# Patient Record
Sex: Female | Born: 1970 | Race: White | Hispanic: No | Smoking: Former smoker
Health system: Southern US, Community
[De-identification: ages and names within clinical notes are randomized; demographics above are authoritative.]

## PROBLEM LIST (undated history)

## (undated) DIAGNOSIS — T8859XA Other complications of anesthesia, initial encounter: Secondary | ICD-10-CM

## (undated) DIAGNOSIS — T7840XA Allergy, unspecified, initial encounter: Secondary | ICD-10-CM

## (undated) DIAGNOSIS — M1990 Unspecified osteoarthritis, unspecified site: Secondary | ICD-10-CM

## (undated) DIAGNOSIS — Z5189 Encounter for other specified aftercare: Secondary | ICD-10-CM

## (undated) DIAGNOSIS — T4145XA Adverse effect of unspecified anesthetic, initial encounter: Secondary | ICD-10-CM

## (undated) DIAGNOSIS — R519 Headache, unspecified: Secondary | ICD-10-CM

## (undated) DIAGNOSIS — E785 Hyperlipidemia, unspecified: Secondary | ICD-10-CM

## (undated) DIAGNOSIS — E8809 Other disorders of plasma-protein metabolism, not elsewhere classified: Secondary | ICD-10-CM

## (undated) DIAGNOSIS — R51 Headache: Secondary | ICD-10-CM

## (undated) DIAGNOSIS — E041 Nontoxic single thyroid nodule: Secondary | ICD-10-CM

## (undated) DIAGNOSIS — F32A Depression, unspecified: Secondary | ICD-10-CM

## (undated) DIAGNOSIS — F419 Anxiety disorder, unspecified: Secondary | ICD-10-CM

## (undated) DIAGNOSIS — E079 Disorder of thyroid, unspecified: Secondary | ICD-10-CM

## (undated) HISTORY — DX: Hyperlipidemia, unspecified: E78.5

## (undated) HISTORY — DX: Disorder of thyroid, unspecified: E07.9

## (undated) HISTORY — PX: WISDOM TOOTH EXTRACTION: SHX21

## (undated) HISTORY — PX: APPENDECTOMY: SHX54

## (undated) HISTORY — PX: WRIST RECONSTRUCTION: SHX2675

## (undated) HISTORY — DX: Allergy, unspecified, initial encounter: T78.40XA

## (undated) HISTORY — DX: Unspecified osteoarthritis, unspecified site: M19.90

## (undated) HISTORY — PX: SHOULDER ARTHROSCOPY: SHX128

## (undated) HISTORY — DX: Other disorders of plasma-protein metabolism, not elsewhere classified: E88.09

## (undated) HISTORY — PX: TONSILLECTOMY: SUR1361

## (undated) HISTORY — DX: Encounter for other specified aftercare: Z51.89

## (undated) HISTORY — PX: INNER EAR SURGERY: SHX679

## (undated) HISTORY — PX: TONSILECTOMY, ADENOIDECTOMY, BILATERAL MYRINGOTOMY AND TUBES: SHX2538

## (undated) HISTORY — PX: SALPINGOOPHORECTOMY: SHX82

## (undated) HISTORY — PX: TUBAL LIGATION: SHX77

## (undated) HISTORY — PX: MINOR CARPAL TUNNEL: SHX6472

---

## 1993-11-22 DIAGNOSIS — E8809 Other disorders of plasma-protein metabolism, not elsewhere classified: Secondary | ICD-10-CM

## 1993-11-22 HISTORY — DX: Other disorders of plasma-protein metabolism, not elsewhere classified: E88.09

## 1994-11-22 DIAGNOSIS — Z5189 Encounter for other specified aftercare: Secondary | ICD-10-CM

## 1994-11-22 HISTORY — DX: Encounter for other specified aftercare: Z51.89

## 2000-11-22 HISTORY — PX: SPINE SURGERY: SHX786

## 2016-12-01 ENCOUNTER — Ambulatory Visit: Payer: Self-pay | Admitting: Family Medicine

## 2016-12-23 ENCOUNTER — Encounter: Payer: Self-pay | Admitting: Family Medicine

## 2016-12-23 ENCOUNTER — Ambulatory Visit (INDEPENDENT_AMBULATORY_CARE_PROVIDER_SITE_OTHER): Payer: Medicare HMO | Admitting: Family Medicine

## 2016-12-23 VITALS — BP 121/77 | HR 87 | Temp 97.4°F | Ht 63.0 in | Wt 179.0 lb

## 2016-12-23 DIAGNOSIS — B351 Tinea unguium: Secondary | ICD-10-CM | POA: Diagnosis not present

## 2016-12-23 DIAGNOSIS — Z131 Encounter for screening for diabetes mellitus: Secondary | ICD-10-CM | POA: Diagnosis not present

## 2016-12-23 DIAGNOSIS — H938X2 Other specified disorders of left ear: Secondary | ICD-10-CM

## 2016-12-23 DIAGNOSIS — Z13220 Encounter for screening for lipoid disorders: Secondary | ICD-10-CM

## 2016-12-23 DIAGNOSIS — Z114 Encounter for screening for human immunodeficiency virus [HIV]: Secondary | ICD-10-CM | POA: Diagnosis not present

## 2016-12-23 MED ORDER — PREDNISONE 20 MG PO TABS: ORAL_TABLET | ORAL | 0 refills | Status: DC

## 2016-12-23 NOTE — Progress Notes (Signed)
BP 121/77   Pulse 87   Temp 97.4 F (36.3 C) (Oral)   Ht 5' 3"  (1.6 m)   Wt 179 lb (81.2 kg)   LMP 12/09/2016 (Approximate)   BMI 31.71 kg/m    Subjective:    Patient ID: Emily Weeks, female    DOB: 08-18-1971, 46 y.o.   MRN: [id number]  HPI: Emily Weeks is a 46 y.o. female presenting on 12/23/2016 for New Patient (Initial Visit) (pt here today to establish care and also c/o left ear "clogged up")   HPI Congestion of left ear Patient comes in today to establish care with our office and has complaints of congestion of her left ear. She has had chronic issues off and on with this congestion and pressure and muffled sound in her left ear. She has had steroids previously to help clear this up. She is currently taking an antihistamine which she takes when she gets like this. She does not feel like it is helping and wants to get another course of prednisone like she has had previously with her other physicians. She does think it's a lot related to allergies. She also gets some sinus congestion and pressure and postnasal drainage but denies any fevers or chills or shortness of breath or wheezing.  Toenail problems Patient has chronic issues with thickened and brittle toenails and toenail fungus that and that causing her to have ingrown toenails that are very painful. She has been fighting this off and on for quite a few years and wants to go see a podiatrist to see if he can help with this. She denies any erythema or warmth but just more tender and then the fungus and thickening and yellowing. There is no purulence or drainage.  Relevant past medical, surgical, family and social history reviewed and updated as indicated. Interim medical history since our last visit reviewed. Allergies and medications reviewed and updated.  Review of Systems  Constitutional: Negative for chills and fever.  HENT: Positive for congestion, postnasal drip, sinus pressure and sneezing. Negative for ear  discharge, ear pain, rhinorrhea and sore throat.   Eyes: Negative for pain, redness and visual disturbance.  Respiratory: Negative for chest tightness and shortness of breath.   Cardiovascular: Negative for chest pain and leg swelling.  Genitourinary: Negative for difficulty urinating and dysuria.  Musculoskeletal: Negative for back pain and gait problem.  Skin: Negative for rash.  Neurological: Negative for light-headedness and headaches.  Psychiatric/Behavioral: Negative for agitation and behavioral problems.  All other systems reviewed and are negative.   Per HPI unless specifically indicated above  Social History   Social History  . Marital status: Married    Spouse name: N/A  . Number of children: N/A  . Years of education: N/A   Occupational History  . Not on file.   Social History Main Topics  . Smoking status: Current Every Day Smoker    Packs/day: 0.50    Types: Cigarettes    Start date: 12/23/1986  . Smokeless tobacco: Never Used  . Alcohol use 1.2 oz/week    2 Cans of beer per week  . Drug use: No  . Sexual activity: Yes    Partners: Male     Comment: married 25 years   Other Topics Concern  . Not on file   Social History Narrative  . No narrative on file    Past Surgical History:  Procedure Laterality Date  . APPENDECTOMY    . INNER EAR SURGERY    .  MINOR CARPAL TUNNEL    . SALPINGOOPHORECTOMY Right   . SHOULDER ARTHROSCOPY    . [place]  2002  . TONSILECTOMY, ADENOIDECTOMY, BILATERAL MYRINGOTOMY AND TUBES    . WRIST RECONSTRUCTION      Family History  Problem Relation Age of Onset  . Arthritis Mother   . Arthritis Father   . Diabetes Father   . Heart disease Father   . Hyperlipidemia Father   . Hypertension Father   . Stroke Father     Allergies as of 12/23/2016      Reactions   Clindamycin/lincomycin Rash   Sulfa Antibiotics Rash      Medication List       Accurate as of 12/23/16  9:23 AM. Always use your most recent med list.            multivitamin-iron-minerals-folic acid chewable tablet Chew 1 tablet by mouth daily.   predniSONE 20 MG tablet Commonly known as:  DELTASONE 2 po at same time daily for 5 days          Objective:    BP 121/77   Pulse 87   Temp 97.4 F (36.3 C) (Oral)   Ht 5' 3"  (1.6 m)   Wt 179 lb (81.2 kg)   LMP 12/09/2016 (Approximate)   BMI 31.71 kg/m   Wt Readings from Last 3 Encounters:  12/23/16 179 lb (81.2 kg)    Physical Exam  Constitutional: She is oriented to person, place, and time. She appears well-developed and well-nourished. No distress.  HENT:  Right Ear: Tympanic membrane and ear canal normal.  Left Ear: Ear canal normal. No drainage. No mastoid tenderness. Tympanic membrane is bulging. Tympanic membrane is not injected, not scarred, not perforated, not erythematous and not retracted.  Nose: Nose normal. Right sinus exhibits no maxillary sinus tenderness and no frontal sinus tenderness. Left sinus exhibits no maxillary sinus tenderness and no frontal sinus tenderness.  Mouth/Throat: Oropharynx is clear and moist and mucous membranes are normal.  Eyes: Conjunctivae are normal.  Neck: Neck supple. No thyromegaly present.  Cardiovascular: Normal rate, regular rhythm, normal heart sounds and intact distal pulses.   No murmur heard. Pulmonary/Chest: Effort normal and breath sounds normal. No respiratory distress. She has no wheezes. She has no rales.  Musculoskeletal: Normal range of motion. She exhibits no edema or tenderness.  Lymphadenopathy:    She has no cervical adenopathy.  Neurological: She is alert and oriented to person, place, and time. Coordination normal.  Skin: Skin is warm and dry. No rash noted. She is not diaphoretic.  Thickened and yellowing of the toenails. No erythema or warmth noted in paronychia region.  Psychiatric: She has a normal mood and affect. Her behavior is normal.  Nursing note and vitals reviewed.   No results found for this or  any previous visit.    Assessment & Plan:   Problem List Items Addressed This Visit    None    Visit Diagnoses    Onychomycosis    -  Primary   Patient has chronic problems with her toenails, would like referral to podiatry.   Relevant Orders   Ambulatory referral to Podiatry   Congestion of left ear       Has had chronic issues off and on with this, usually gets prednisone for this, we'll do Flonase and prednisone and antihistamine   Relevant Medications   predniSONE (DELTASONE) 20 MG tablet   Lipid screening       Relevant Medications  atorvastatin (LIPITOR) 40 MG tablet   Other Relevant Orders   Lipid panel (Completed)   Diabetes mellitus screening       Relevant Orders   CMP14+EGFR (Completed)   Screening for HIV without presence of risk factors       Relevant Orders   HIV antibody (Completed)       Follow up plan: Return if symptoms worsen or fail to improve.  Caryl Pina, MD [facility] 12/23/2016, 9:23 AM

## 2016-12-24 LAB — CMP14+EGFR
A/G RATIO: 1.7 (ref 1.2–2.2)
ALBUMIN: 4.5 g/dL (ref 3.5–5.5)
ALK PHOS: 62 IU/L (ref 39–117)
ALT: 10 IU/L (ref 0–32)
AST: 10 IU/L (ref 0–40)
BILIRUBIN TOTAL: 0.3 mg/dL (ref 0.0–1.2)
BUN / CREAT RATIO: 14 (ref 9–23)
BUN: 11 mg/dL (ref 6–24)
CHLORIDE: 102 mmol/L (ref 96–106)
CO2: 24 mmol/L (ref 18–29)
Calcium: 9.7 mg/dL (ref 8.7–10.2)
Creatinine, Ser: 0.77 mg/dL (ref 0.57–1.00)
GFR calc Af Amer: 109 mL/min/{1.73_m2} (ref >59)
GFR calc non Af Amer: 94 mL/min/{1.73_m2} (ref >59)
GLUCOSE: 84 mg/dL (ref 65–99)
Globulin, Total: 2.6 g/dL (ref 1.5–4.5)
POTASSIUM: 4.9 mmol/L (ref 3.5–5.2)
Sodium: 142 mmol/L (ref 134–144)
Total Protein: 7.1 g/dL (ref 6.0–8.5)

## 2016-12-24 LAB — LIPID PANEL
CHOL/HDL RATIO: 8.1 ratio — AB (ref 0.0–4.4)
CHOLESTEROL TOTAL: 307 mg/dL — AB (ref 100–199)
HDL: 38 mg/dL — ABNORMAL LOW (ref >39)
LDL Calculated: 221 mg/dL — ABNORMAL HIGH (ref 0–99)
Triglycerides: 239 mg/dL — ABNORMAL HIGH (ref 0–149)
VLDL Cholesterol Cal: 48 mg/dL — ABNORMAL HIGH (ref 5–40)

## 2016-12-24 LAB — HIV ANTIBODY (ROUTINE TESTING W REFLEX): HIV SCREEN 4TH GENERATION: NONREACTIVE

## 2016-12-24 MED ORDER — ATORVASTATIN CALCIUM 40 MG PO TABS: 40.0000 mg | ORAL_TABLET | Freq: Every day | ORAL | 3 refills | Status: DC

## 2017-01-03 ENCOUNTER — Encounter: Payer: Self-pay | Admitting: Family Medicine

## 2017-01-13 DIAGNOSIS — L600 Ingrowing nail: Secondary | ICD-10-CM | POA: Diagnosis not present

## 2017-01-13 DIAGNOSIS — L03031 Cellulitis of right toe: Secondary | ICD-10-CM | POA: Diagnosis not present

## 2017-01-20 ENCOUNTER — Ambulatory Visit: Payer: Medicare HMO | Admitting: Family Medicine

## 2017-01-21 ENCOUNTER — Ambulatory Visit: Payer: Medicare HMO | Admitting: Family Medicine

## 2017-02-01 DIAGNOSIS — L03031 Cellulitis of right toe: Secondary | ICD-10-CM | POA: Diagnosis not present

## 2017-02-01 DIAGNOSIS — L600 Ingrowing nail: Secondary | ICD-10-CM | POA: Diagnosis not present

## 2017-02-17 DIAGNOSIS — B351 Tinea unguium: Secondary | ICD-10-CM | POA: Diagnosis not present

## 2017-02-17 DIAGNOSIS — L03032 Cellulitis of left toe: Secondary | ICD-10-CM | POA: Diagnosis not present

## 2017-04-27 ENCOUNTER — Ambulatory Visit (INDEPENDENT_AMBULATORY_CARE_PROVIDER_SITE_OTHER): Payer: Medicare HMO | Admitting: Pediatrics

## 2017-04-27 ENCOUNTER — Encounter: Payer: Self-pay | Admitting: Pediatrics

## 2017-04-27 VITALS — BP 99/65 | HR 71 | Temp 97.0°F | Ht 63.0 in | Wt 173.0 lb

## 2017-04-27 DIAGNOSIS — N63 Unspecified lump in unspecified breast: Secondary | ICD-10-CM

## 2017-04-27 DIAGNOSIS — Z0000 Encounter for general adult medical examination without abnormal findings: Secondary | ICD-10-CM | POA: Diagnosis not present

## 2017-04-27 DIAGNOSIS — Z1239 Encounter for other screening for malignant neoplasm of breast: Secondary | ICD-10-CM

## 2017-04-27 DIAGNOSIS — E785 Hyperlipidemia, unspecified: Secondary | ICD-10-CM | POA: Diagnosis not present

## 2017-04-27 DIAGNOSIS — R232 Flushing: Secondary | ICD-10-CM | POA: Diagnosis not present

## 2017-04-27 MED ORDER — VENLAFAXINE HCL 37.5 MG PO TABS: 37.5000 mg | ORAL_TABLET | Freq: Two times a day (BID) | ORAL | 3 refills | Status: DC

## 2017-04-27 NOTE — Progress Notes (Signed)
  Subjective:   Patient ID: Emily Weeks, female    DOB: December 08, 1970, 46 y.o.   MRN: [id number] CC: Annual Exam; Gynecologic Exam (Last Menstrual cycle march 2018); and Hot Flashes  HPI: Emily Weeks is a 46 y.o. female presenting for Annual Exam; Gynecologic Exam (Last Menstrual cycle march 2018); and Hot Flashes  Hot flashes started about 9 months ago LMP in March Past 3 months has had hot flashes No vaginal discharge, no dyspareunia  No family history of breast cancer Has R sided breast lump she says "was being followed" in [place] prior to moving down here No biopsy of R side Had ultrasound of lump on L side, told it was fatty tissue  HLD: was on atorvastatin, caused achiness so stopped Walks regularly now  Migraines: has pressure at base of head at times Happening a few times a month  Normal stooling  Relevant past medical, surgical, family and social history reviewed. Allergies and medications reviewed and updated. History  Smoking Status  . Current Every Day Smoker  . Packs/day: 0.50  . Types: Cigarettes  . Start date: 12/23/1986  Smokeless Tobacco  . Never Used   ROS: All systems negative other than what is in HPI  Objective:    BP 99/65   Pulse 71   Temp 97 F (36.1 C) (Oral)   Ht 5' 3"  (1.6 m)   Wt 173 lb (78.5 kg)   LMP 01/25/2017   BMI 30.65 kg/m   Wt Readings from Last 3 Encounters:  04/27/17 173 lb (78.5 kg)  12/23/16 179 lb (81.2 kg)    Gen: NAD, alert, cooperative with exam, NCAT EYES: EOMI, no conjunctival injection, or no icterus ENT:  TMs pearly gray b/l, OP without erythema LYMPH: no cervical LAD CV: NRRR, normal S1/S2, no murmur, distal pulses 2+ b/l Resp: CTABL, no wheezes, normal WOB Abd: +BS, soft, NTND. no guarding or organomegaly Ext: No edema, warm Neuro: Alert and oriented, strength equal b/l UE and LE, coordination grossly normal GU: normal ext female genitalia, scant discharge present in vaginal vault, normal appearing  cervix Breast: R outer upper quadrant with 1 cm palpable mass Densities in L breast upper quadrants  Chaperone present throughout exam  Assessment & Plan:  Emily Weeks was seen today for annual exam, gynecologic exam and hot flashes.  Diagnoses and all orders for this visit:  Encounter for preventive health examination -     BMP8+EGFR  Breast lump eval R breast lump Had u/s of L breast recently per pt -     MM Digital Diagnostic Unilat R; Future  Screening for breast cancer -     MM Digital Screening; Future  Hot flashes -     TSH -     venlafaxine (EFFEXOR) 37.5 MG tablet; Take 1 tablet (37.5 mg total) by mouth 2 (two) times daily.  Hyperlipidemia, unspecified hyperlipidemia type Fasting today, not tolerant of atorvastatin Repeat lipid panel -     Lipid panel -     Pap IG and HPV (high risk) DNA detection   Follow up plan: 3 months Assunta Found, MD [place]

## 2017-04-28 ENCOUNTER — Other Ambulatory Visit: Payer: Self-pay | Admitting: Pediatrics

## 2017-04-28 DIAGNOSIS — E785 Hyperlipidemia, unspecified: Secondary | ICD-10-CM

## 2017-04-28 DIAGNOSIS — E782 Mixed hyperlipidemia: Secondary | ICD-10-CM | POA: Insufficient documentation

## 2017-04-28 LAB — LIPID PANEL
CHOLESTEROL TOTAL: 311 mg/dL — AB (ref 100–199)
Chol/HDL Ratio: 8.2 ratio — ABNORMAL HIGH (ref 0.0–4.4)
HDL: 38 mg/dL — AB (ref >39)
LDL Calculated: 236 mg/dL — ABNORMAL HIGH (ref 0–99)
TRIGLYCERIDES: 186 mg/dL — AB (ref 0–149)
VLDL CHOLESTEROL CAL: 37 mg/dL (ref 5–40)

## 2017-04-28 LAB — BMP8+EGFR
BUN / CREAT RATIO: 16 (ref 9–23)
BUN: 13 mg/dL (ref 6–24)
CO2: 22 mmol/L (ref 18–29)
CREATININE: 0.82 mg/dL (ref 0.57–1.00)
Calcium: 9.8 mg/dL (ref 8.7–10.2)
Chloride: 102 mmol/L (ref 96–106)
GFR, EST AFRICAN AMERICAN: 101 mL/min/{1.73_m2} (ref >59)
GFR, EST NON AFRICAN AMERICAN: 87 mL/min/{1.73_m2} (ref >59)
Glucose: 97 mg/dL (ref 65–99)
Potassium: 4.4 mmol/L (ref 3.5–5.2)
SODIUM: 140 mmol/L (ref 134–144)

## 2017-04-28 LAB — TSH: TSH: 2.39 u[IU]/mL (ref 0.450–4.500)

## 2017-04-28 MED ORDER — PRAVASTATIN SODIUM 20 MG PO TABS: 20.0000 mg | ORAL_TABLET | Freq: Every day | ORAL | 3 refills | Status: DC

## 2017-04-29 ENCOUNTER — Encounter: Payer: Self-pay | Admitting: Pediatrics

## 2017-04-30 LAB — PAP IG AND HPV HIGH-RISK
HPV, high-risk: NEGATIVE
PAP Smear Comment: 0

## 2017-04-30 MED ORDER — FLUOXETINE HCL 10 MG PO TABS: 10.0000 mg | ORAL_TABLET | Freq: Every day | ORAL | 3 refills | Status: DC

## 2017-05-09 ENCOUNTER — Other Ambulatory Visit: Payer: Self-pay | Admitting: Pediatrics

## 2017-05-09 ENCOUNTER — Encounter: Payer: Self-pay | Admitting: Pediatrics

## 2017-05-09 DIAGNOSIS — N6310 Unspecified lump in the right breast, unspecified quadrant: Secondary | ICD-10-CM

## 2017-05-24 ENCOUNTER — Encounter (HOSPITAL_COMMUNITY): Payer: Self-pay

## 2017-06-01 ENCOUNTER — Other Ambulatory Visit: Payer: Self-pay | Admitting: Pediatrics

## 2017-06-01 DIAGNOSIS — N6320 Unspecified lump in the left breast, unspecified quadrant: Secondary | ICD-10-CM

## 2017-06-07 ENCOUNTER — Ambulatory Visit (HOSPITAL_COMMUNITY)
Admission: RE | Admit: 2017-06-07 | Discharge: 2017-06-07 | Disposition: A | Discharge status: Home / Self Care | Payer: Medicare HMO | Source: Ambulatory Visit | Attending: Pediatrics | Admitting: Pediatrics

## 2017-06-07 DIAGNOSIS — N63 Unspecified lump in unspecified breast: Secondary | ICD-10-CM

## 2017-06-07 DIAGNOSIS — N6310 Unspecified lump in the right breast, unspecified quadrant: Secondary | ICD-10-CM

## 2017-06-07 DIAGNOSIS — N6001 Solitary cyst of right breast: Secondary | ICD-10-CM | POA: Diagnosis not present

## 2017-06-07 DIAGNOSIS — N6314 Unspecified lump in the right breast, lower inner quadrant: Secondary | ICD-10-CM | POA: Insufficient documentation

## 2017-06-07 DIAGNOSIS — R928 Other abnormal and inconclusive findings on diagnostic imaging of breast: Secondary | ICD-10-CM | POA: Diagnosis not present

## 2017-06-14 ENCOUNTER — Encounter: Payer: Self-pay | Admitting: Pediatrics

## 2017-06-30 ENCOUNTER — Other Ambulatory Visit: Payer: Self-pay | Admitting: Pediatrics

## 2017-06-30 DIAGNOSIS — N6310 Unspecified lump in the right breast, unspecified quadrant: Secondary | ICD-10-CM

## 2017-07-28 ENCOUNTER — Encounter: Payer: Self-pay | Admitting: Pediatrics

## 2017-07-28 ENCOUNTER — Ambulatory Visit (INDEPENDENT_AMBULATORY_CARE_PROVIDER_SITE_OTHER): Payer: Medicare HMO | Admitting: Pediatrics

## 2017-07-28 VITALS — BP 110/65 | HR 65 | Temp 98.0°F | Ht 63.0 in | Wt 176.4 lb

## 2017-07-28 DIAGNOSIS — E785 Hyperlipidemia, unspecified: Secondary | ICD-10-CM

## 2017-07-28 DIAGNOSIS — R232 Flushing: Secondary | ICD-10-CM

## 2017-07-28 DIAGNOSIS — Z720 Tobacco use: Secondary | ICD-10-CM | POA: Diagnosis not present

## 2017-07-28 DIAGNOSIS — R928 Other abnormal and inconclusive findings on diagnostic imaging of breast: Secondary | ICD-10-CM

## 2017-07-28 MED ORDER — FLUOXETINE HCL 20 MG PO CAPS: 20.0000 mg | ORAL_CAPSULE | Freq: Every day | ORAL | 3 refills | Status: DC

## 2017-07-28 NOTE — Progress Notes (Signed)
  Subjective:   Patient ID: [name], female    DOB: Apr 16, 1971, 46 y.o.   MRN: [id number] CC: Follow-up (3 month ) and Hyperlipidemia  HPI: Doreather Hoxworth is a 46 y.o. female presenting for Follow-up (3 month ) and Hyperlipidemia  Trying to quit smoking Thinking getting a vape pen, everyone smokes around her, family, friends, has been hard  HLD: no s/e from pravastatin, taking medicine daily  Still on prozac 88m Not sure if helps with hot flashes or not  Abnormal mammogram, has f/u [place]1 for 6 mo follow up  Relevant past medical, surgical, family and social history reviewed. Allergies and medications reviewed and updated. History  Smoking Status  . Current Every Day Smoker  . Packs/day: 0.50  . Types: Cigarettes  . Start date: 12/23/1986  Smokeless Tobacco  . Never Used   ROS: Per HPI   Objective:    BP 110/65   Pulse 65   Temp 98 F (36.7 C) (Oral)   Ht 5' 3"  (1.6 m)   Wt 176 lb 6.4 oz (80 kg)   BMI 31.25 kg/m   Wt Readings from Last 3 Encounters:  07/28/17 176 lb 6.4 oz (80 kg)  04/27/17 173 lb (78.5 kg)  12/23/16 179 lb (81.2 kg)    Gen: NAD, alert, cooperative with exam, NCAT EYES: EOMI, no conjunctival injection, or no icterus ENT:   OP without erythema LYMPH: no cervical LAD CV: NRRR, normal S1/S2, no murmur, distal pulses 2+ b/l Resp: CTABL, no wheezes, normal WOB Abd: +BS, soft, NTND.  Ext: No edema, warm Neuro: Alert and oriented, strength equal b/l UE and LE, coordination grossly normal Psych: nl affect   Assessment & Plan:  SConsettawas seen today for follow-up and hyperlipidemia.  Diagnoses and all orders for this visit:  Hyperlipidemia, unspecified hyperlipidemia type Stable, cont pravastatin  Hot flashes Ongoing symptoms, increase to 254mdaily -     FLUoxetine (PROZAC) 20 MG capsule; Take 1 capsule (20 mg total) by mouth daily.  Tobacco use Cont to encourage cessation  Abnormal mammogram Has 6 mo f/u in  Jan  Follow up plan: Return in about 6 months (around 01/25/2018). CaAssunta FoundMD WeNorth [place]

## 2017-09-10 DIAGNOSIS — H5213 Myopia, bilateral: Secondary | ICD-10-CM | POA: Diagnosis not present

## 2017-09-20 DIAGNOSIS — H52209 Unspecified astigmatism, unspecified eye: Secondary | ICD-10-CM | POA: Diagnosis not present

## 2017-09-20 DIAGNOSIS — H5203 Hypermetropia, bilateral: Secondary | ICD-10-CM | POA: Diagnosis not present

## 2017-10-19 ENCOUNTER — Encounter: Payer: Self-pay | Admitting: Pediatrics

## 2017-10-19 ENCOUNTER — Ambulatory Visit (INDEPENDENT_AMBULATORY_CARE_PROVIDER_SITE_OTHER): Payer: Medicare HMO | Admitting: Pediatrics

## 2017-10-19 VITALS — BP 137/79 | HR 77 | Temp 97.4°F | Ht 63.0 in | Wt 174.2 lb

## 2017-10-19 DIAGNOSIS — Z23 Encounter for immunization: Secondary | ICD-10-CM

## 2017-10-19 DIAGNOSIS — M4802 Spinal stenosis, cervical region: Secondary | ICD-10-CM | POA: Diagnosis not present

## 2017-10-19 DIAGNOSIS — E7849 Other hyperlipidemia: Secondary | ICD-10-CM

## 2017-10-19 DIAGNOSIS — R200 Anesthesia of skin: Secondary | ICD-10-CM | POA: Diagnosis not present

## 2017-10-19 DIAGNOSIS — L732 Hidradenitis suppurativa: Secondary | ICD-10-CM | POA: Diagnosis not present

## 2017-10-19 DIAGNOSIS — R202 Paresthesia of skin: Secondary | ICD-10-CM

## 2017-10-19 DIAGNOSIS — R29898 Other symptoms and signs involving the musculoskeletal system: Secondary | ICD-10-CM | POA: Diagnosis not present

## 2017-10-19 DIAGNOSIS — E785 Hyperlipidemia, unspecified: Secondary | ICD-10-CM | POA: Diagnosis not present

## 2017-10-19 MED ORDER — CLINDAMYCIN PHOSPHATE 1 % EX GEL: Freq: Two times a day (BID) | CUTANEOUS | 0 refills | Status: DC
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
## 2017-10-19 NOTE — Patient Instructions (Addendum)
  [facility] Address: [address] [place], [place] [address] Hours:  Phone: [phone]  [place] Address: [place], [place], [place] [address] Hours:  Phone: [phone]

## 2017-10-20 LAB — LIPID PANEL
CHOL/HDL RATIO: 6.6 ratio — AB (ref 0.0–4.4)
CHOLESTEROL TOTAL: 278 mg/dL — AB (ref 100–199)
HDL: 42 mg/dL (ref >39)
LDL Calculated: 200 mg/dL — ABNORMAL HIGH (ref 0–99)
TRIGLYCERIDES: 178 mg/dL — AB (ref 0–149)
VLDL Cholesterol Cal: 36 mg/dL (ref 5–40)

## 2017-10-20 MED ORDER — PRAVASTATIN SODIUM 80 MG PO TABS: 80.0000 mg | ORAL_TABLET | Freq: Every day | ORAL | 1 refills | Status: DC

## 2017-10-20 NOTE — Addendum Note (Signed)
Addended by: Eustaquio Maize on: 10/20/2017 08:57 AM   Modules accepted: Orders

## 2017-10-25 ENCOUNTER — Emergency Department (HOSPITAL_COMMUNITY): Payer: Medicare HMO

## 2017-10-25 ENCOUNTER — Ambulatory Visit (HOSPITAL_COMMUNITY): Admission: RE | Admit: 2017-10-25 | Payer: Medicare HMO | Source: Ambulatory Visit

## 2017-10-25 ENCOUNTER — Encounter (HOSPITAL_COMMUNITY): Payer: Self-pay | Admitting: Emergency Medicine

## 2017-10-25 ENCOUNTER — Other Ambulatory Visit: Payer: Self-pay

## 2017-10-25 ENCOUNTER — Emergency Department (HOSPITAL_COMMUNITY)
Admission: EM | Admit: 2017-10-25 | Discharge: 2017-10-25 | Disposition: A | Discharge status: Home / Self Care | Payer: Medicare HMO | Attending: Emergency Medicine | Admitting: Emergency Medicine

## 2017-10-25 DIAGNOSIS — T23192A Burn of first degree of multiple sites of left wrist and hand, initial encounter: Secondary | ICD-10-CM | POA: Diagnosis not present

## 2017-10-25 DIAGNOSIS — T2006XA Burn of unspecified degree of forehead and cheek, initial encounter: Secondary | ICD-10-CM | POA: Insufficient documentation

## 2017-10-25 DIAGNOSIS — T23102A Burn of first degree of left hand, unspecified site, initial encounter: Secondary | ICD-10-CM | POA: Diagnosis not present

## 2017-10-25 DIAGNOSIS — T23101A Burn of first degree of right hand, unspecified site, initial encounter: Secondary | ICD-10-CM | POA: Insufficient documentation

## 2017-10-25 DIAGNOSIS — T310 Burns involving less than 10% of body surface: Secondary | ICD-10-CM | POA: Diagnosis not present

## 2017-10-25 DIAGNOSIS — R079 Chest pain, unspecified: Secondary | ICD-10-CM | POA: Diagnosis not present

## 2017-10-25 DIAGNOSIS — Y929 Unspecified place or not applicable: Secondary | ICD-10-CM | POA: Diagnosis not present

## 2017-10-25 DIAGNOSIS — T23009A Burn of unspecified degree of unspecified hand, unspecified site, initial encounter: Secondary | ICD-10-CM | POA: Diagnosis present

## 2017-10-25 DIAGNOSIS — T23201A Burn of second degree of right hand, unspecified site, initial encounter: Secondary | ICD-10-CM | POA: Diagnosis not present

## 2017-10-25 DIAGNOSIS — T24032A Burn of unspecified degree of left lower leg, initial encounter: Secondary | ICD-10-CM | POA: Diagnosis not present

## 2017-10-25 DIAGNOSIS — T24031A Burn of unspecified degree of right lower leg, initial encounter: Secondary | ICD-10-CM | POA: Diagnosis not present

## 2017-10-25 DIAGNOSIS — Y998 Other external cause status: Secondary | ICD-10-CM | POA: Diagnosis not present

## 2017-10-25 DIAGNOSIS — Y9389 Activity, other specified: Secondary | ICD-10-CM | POA: Diagnosis not present

## 2017-10-25 DIAGNOSIS — T300 Burn of unspecified body region, unspecified degree: Secondary | ICD-10-CM

## 2017-10-25 DIAGNOSIS — W408XXA Explosion of other specified explosive materials, initial encounter: Secondary | ICD-10-CM | POA: Insufficient documentation

## 2017-10-25 DIAGNOSIS — T2101XA Burn of unspecified degree of chest wall, initial encounter: Secondary | ICD-10-CM | POA: Diagnosis not present

## 2017-10-25 DIAGNOSIS — M79641 Pain in right hand: Secondary | ICD-10-CM | POA: Diagnosis not present

## 2017-10-25 DIAGNOSIS — M79642 Pain in left hand: Secondary | ICD-10-CM | POA: Diagnosis not present

## 2017-10-25 MED ORDER — OXYCODONE-ACETAMINOPHEN 5-325 MG PO TABS
2.0000 | ORAL_TABLET | Freq: Once | ORAL | Status: AC
  Administered 2017-10-25: 2 via ORAL
  Filled 2017-10-25: qty 2

## 2017-10-25 MED ORDER — OXYCODONE-ACETAMINOPHEN 5-325 MG PO TABS: 2.0000 | ORAL_TABLET | ORAL | 0 refills | Status: DC | PRN

## 2017-10-25 MED ORDER — SILVER SULFADIAZINE 1 % EX CREA: 1.0000 "application " | TOPICAL_CREAM | Freq: Every day | CUTANEOUS | 0 refills | Status: DC

## 2017-10-25 MED ORDER — SILVER SULFADIAZINE 1 % EX CREA
TOPICAL_CREAM | Freq: Once | CUTANEOUS | Status: AC
  Administered 2017-10-25: 08:00:00 via TOPICAL
  Filled 2017-10-25: qty 50
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
## 2017-10-25 NOTE — ED Notes (Signed)
EDP at bedside  
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
## 2017-10-26 ENCOUNTER — Ambulatory Visit: Payer: Medicare HMO | Admitting: Pediatrics

## 2017-10-26 ENCOUNTER — Encounter: Payer: Self-pay | Admitting: Pediatrics

## 2017-10-26 VITALS — BP 137/77 | HR 73 | Temp 97.8°F | Ht 63.0 in | Wt 177.4 lb

## 2017-10-26 DIAGNOSIS — M79642 Pain in left hand: Secondary | ICD-10-CM

## 2017-10-26 DIAGNOSIS — T2019XA Burn of first degree of multiple sites of head, face, and neck, initial encounter: Secondary | ICD-10-CM

## 2017-10-26 DIAGNOSIS — T300 Burn of unspecified body region, unspecified degree: Secondary | ICD-10-CM

## 2017-10-26 MED ORDER — OXYCODONE-ACETAMINOPHEN 5-325 MG PO TABS: 1.0000 | ORAL_TABLET | Freq: Four times a day (QID) | ORAL | 0 refills | Status: DC | PRN

## 2017-10-26 MED ORDER — SILVER SULFADIAZINE 1 % EX CREA: 1.0000 "application " | TOPICAL_CREAM | Freq: Every day | CUTANEOUS | 0 refills | Status: DC
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
## 2017-10-26 NOTE — Telephone Encounter (Signed)
Can you call pt, get her an appt to be seen with me today or tomorrow. Can be add on if has to be.

## 2017-10-31 ENCOUNTER — Ambulatory Visit (HOSPITAL_COMMUNITY)
Admission: RE | Admit: 2017-10-31 | Discharge: 2017-10-31 | Disposition: A | Discharge status: Home / Self Care | Payer: Medicare HMO | Source: Ambulatory Visit | Attending: Pediatrics | Admitting: Pediatrics

## 2017-10-31 DIAGNOSIS — M4802 Spinal stenosis, cervical region: Secondary | ICD-10-CM | POA: Diagnosis not present

## 2017-10-31 DIAGNOSIS — M5023 Other cervical disc displacement, cervicothoracic region: Secondary | ICD-10-CM | POA: Insufficient documentation

## 2017-10-31 DIAGNOSIS — M542 Cervicalgia: Secondary | ICD-10-CM | POA: Diagnosis not present

## 2017-11-02 ENCOUNTER — Other Ambulatory Visit: Payer: Self-pay | Admitting: Pediatrics

## 2017-11-02 DIAGNOSIS — M4802 Spinal stenosis, cervical region: Secondary | ICD-10-CM

## 2017-11-03 ENCOUNTER — Encounter: Payer: Self-pay | Admitting: Pediatrics

## 2017-11-03 ENCOUNTER — Ambulatory Visit (INDEPENDENT_AMBULATORY_CARE_PROVIDER_SITE_OTHER): Payer: Medicare HMO | Admitting: Pediatrics

## 2017-11-03 VITALS — BP 135/89 | HR 90 | Temp 97.2°F | Ht 63.0 in | Wt 177.0 lb

## 2017-11-03 DIAGNOSIS — R202 Paresthesia of skin: Secondary | ICD-10-CM

## 2017-11-03 DIAGNOSIS — M79642 Pain in left hand: Secondary | ICD-10-CM

## 2017-11-03 DIAGNOSIS — T2019XD Burn of first degree of multiple sites of head, face, and neck, subsequent encounter: Secondary | ICD-10-CM

## 2017-11-03 DIAGNOSIS — T148XXA Other injury of unspecified body region, initial encounter: Secondary | ICD-10-CM

## 2017-11-03 DIAGNOSIS — T300 Burn of unspecified body region, unspecified degree: Secondary | ICD-10-CM

## 2017-11-03 DIAGNOSIS — R200 Anesthesia of skin: Secondary | ICD-10-CM
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
## 2017-11-20 ENCOUNTER — Encounter: Payer: Self-pay | Admitting: Pediatrics

## 2017-11-23 NOTE — Telephone Encounter (Signed)
I looked at the MRI from 12/10 and per your note says patient will be referred to neurosurgery. No referral was ever placed. Do you want [place] to order referral?

## 2017-11-23 NOTE — Telephone Encounter (Signed)
Referral per chart was placed 12/12, looks like debbi has called multiple times to see if scheduled with [place] [facility].

## 2017-12-01 DIAGNOSIS — R030 Elevated blood-pressure reading, without diagnosis of hypertension: Secondary | ICD-10-CM | POA: Diagnosis not present

## 2017-12-01 DIAGNOSIS — M5412 Radiculopathy, cervical region: Secondary | ICD-10-CM | POA: Diagnosis not present

## 2017-12-01 DIAGNOSIS — Z6830 Body mass index (BMI) 30.0-30.9, adult: Secondary | ICD-10-CM | POA: Diagnosis not present

## 2017-12-05 DIAGNOSIS — M4722 Other spondylosis with radiculopathy, cervical region: Secondary | ICD-10-CM | POA: Diagnosis not present

## 2017-12-05 DIAGNOSIS — M9981 Other biomechanical lesions of cervical region: Secondary | ICD-10-CM | POA: Diagnosis not present

## 2017-12-05 DIAGNOSIS — M5412 Radiculopathy, cervical region: Secondary | ICD-10-CM | POA: Diagnosis not present

## 2017-12-05 DIAGNOSIS — R030 Elevated blood-pressure reading, without diagnosis of hypertension: Secondary | ICD-10-CM | POA: Diagnosis not present

## 2017-12-07 NOTE — Telephone Encounter (Signed)
Is the referral taken care of yet?

## 2017-12-13 ENCOUNTER — Ambulatory Visit (HOSPITAL_COMMUNITY): Payer: Medicare HMO

## 2017-12-14 DIAGNOSIS — M9981 Other biomechanical lesions of cervical region: Secondary | ICD-10-CM | POA: Diagnosis not present

## 2017-12-14 DIAGNOSIS — M5412 Radiculopathy, cervical region: Secondary | ICD-10-CM | POA: Diagnosis not present

## 2017-12-27 ENCOUNTER — Other Ambulatory Visit: Payer: Self-pay | Admitting: Pediatrics

## 2017-12-27 DIAGNOSIS — E785 Hyperlipidemia, unspecified: Secondary | ICD-10-CM

## 2018-01-12 ENCOUNTER — Other Ambulatory Visit: Payer: Self-pay | Admitting: Neurosurgery

## 2018-01-12 ENCOUNTER — Encounter: Payer: Self-pay | Admitting: Pediatrics

## 2018-01-12 DIAGNOSIS — M9981 Other biomechanical lesions of cervical region: Secondary | ICD-10-CM | POA: Diagnosis not present

## 2018-01-17 ENCOUNTER — Encounter (HOSPITAL_COMMUNITY): Payer: Self-pay

## 2018-01-17 NOTE — Pre-Procedure Instructions (Signed)
Emily Weeks  01/17/2018      [facility] [place] [address] [place] [address] Phone: [phone] Fax: [phone]    Your procedure is scheduled on 01-19-2018 Thursday   Report to [facility] Admitting at 6:00 A.M.   Call this number if you have problems the morning of surgery:  [phone]   Remember:  Do not eat food or drink liquids after midnight.   Take these medicines the morning of surgery with A SIP OF WATER  Fluoxetine(Prozac) Pravastatin(Pravachol)  STOP TAKING ANY ASPIRIN(UNLESS OTHERWISE INSTRUCTED BY YOUR SURGEON),ANTIINFLAMATORIES (IBUPROFEN,ALEVE,MOTRIN,ADVIL,GOODY'S POWDERS),HERBAL SUPPLEMENTS,FISH OIL,AND VITAMINS 5-7 DAYS PRIOR TO SURGERY     Do not wear jewelry, make-up or nail polish.  Do not wear lotions, powders, or perfumes, or deodorant.  Do not shave 48 hours prior to surgery.  Men may shave face and neck.   Do not bring valuables to the hospital.   [facility] is not responsible for any belongings or valuables.  Contacts, dentures or bridgework may not be worn into surgery.  Leave your suitcase in the car.  After surgery it may be brought to your room.  For patients admitted to the hospital, discharge time will be determined by your treatment team.  Patients discharged the day of surgery will not be allowed to drive home.    Special Instructions: [facility] - Preparing for Surgery  Before surgery, you can play an important role.  Because skin is not sterile, your skin needs to be as free of germs as possible.  You can reduce the number of germs on you skin by washing with CHG (chlorahexidine gluconate) soap before surgery.  CHG is an antiseptic cleaner which kills germs and bonds with the skin to continue killing germs even after washing.  Please DO NOT use if you have an allergy to CHG or antibacterial soaps.  If your skin becomes reddened/irritated stop using the CHG and  inform your nurse when you arrive at Short Stay.  Do not shave (including legs and underarms) for at least 48 hours prior to the first CHG shower.  You may shave your face.  Please follow these instructions carefully:   1.  Shower with CHG Soap the night before surgery and the   morning of Surgery.  2.  If you choose to wash your hair, wash your hair first as usual with your normal shampoo.  3.  After you shampoo, rinse your hair and body thoroughly to remove the  Shampoo.  4.  Use CHG as you would any other liquid soap.  You can apply chg directly  to the skin and wash gently with scrungie or a clean washcloth.  5.  Apply the CHG Soap to your body ONLY FROM THE NECK DOWN.   Do not use on open wounds or open sores.  Avoid contact with your eyes,  ears, mouth and genitals (private parts).  Wash genitals (private parts) with your normal soap.  6.  Wash thoroughly, paying special attention to the area where your surgery will be performed.  7.  Thoroughly rinse your body with warm water from the neck down.  8.  DO NOT shower/wash with your normal soap after using and rinsing o  the CHG Soap.  9.  Pat yourself dry with a clean towel.            10.  Wear clean pajamas.  11.  Place clean sheets on your bed the night of your first shower and do not sleep with pets.  Day of Surgery  Do not apply any lotions/deodorants the morning of surgery.  Please wear clean clothes to the hospital/surgery center.   Please read over the following fact sheets that you were given. Pain Booklet and Surgical Site Infection Prevention

## 2018-01-18 ENCOUNTER — Encounter (HOSPITAL_COMMUNITY): Payer: Self-pay | Admitting: Certified Registered Nurse Anesthetist

## 2018-01-18 ENCOUNTER — Encounter (HOSPITAL_COMMUNITY)
Admission: RE | Admit: 2018-01-18 | Discharge: 2018-01-18 | Disposition: A | Discharge status: Home / Self Care | Payer: Medicare HMO | Source: Ambulatory Visit | Attending: Neurosurgery | Admitting: Neurosurgery

## 2018-01-18 ENCOUNTER — Encounter (HOSPITAL_COMMUNITY): Payer: Self-pay

## 2018-01-18 ENCOUNTER — Other Ambulatory Visit: Payer: Self-pay

## 2018-01-18 DIAGNOSIS — Z885 Allergy status to narcotic agent status: Secondary | ICD-10-CM | POA: Diagnosis not present

## 2018-01-18 DIAGNOSIS — E8809 Other disorders of plasma-protein metabolism, not elsewhere classified: Secondary | ICD-10-CM | POA: Diagnosis not present

## 2018-01-18 DIAGNOSIS — Z881 Allergy status to other antibiotic agents status: Secondary | ICD-10-CM | POA: Diagnosis not present

## 2018-01-18 DIAGNOSIS — Z79899 Other long term (current) drug therapy: Secondary | ICD-10-CM | POA: Diagnosis not present

## 2018-01-18 DIAGNOSIS — M4722 Other spondylosis with radiculopathy, cervical region: Secondary | ICD-10-CM | POA: Diagnosis not present

## 2018-01-18 DIAGNOSIS — Z882 Allergy status to sulfonamides status: Secondary | ICD-10-CM | POA: Diagnosis not present

## 2018-01-18 DIAGNOSIS — M4723 Other spondylosis with radiculopathy, cervicothoracic region: Secondary | ICD-10-CM | POA: Diagnosis not present

## 2018-01-18 DIAGNOSIS — M1990 Unspecified osteoarthritis, unspecified site: Secondary | ICD-10-CM | POA: Diagnosis not present

## 2018-01-18 DIAGNOSIS — M4803 Spinal stenosis, cervicothoracic region: Secondary | ICD-10-CM | POA: Diagnosis not present

## 2018-01-18 DIAGNOSIS — Z884 Allergy status to anesthetic agent status: Secondary | ICD-10-CM | POA: Diagnosis not present

## 2018-01-18 DIAGNOSIS — M2578 Osteophyte, vertebrae: Secondary | ICD-10-CM | POA: Diagnosis not present

## 2018-01-18 DIAGNOSIS — M4802 Spinal stenosis, cervical region: Secondary | ICD-10-CM | POA: Diagnosis not present

## 2018-01-18 DIAGNOSIS — F17210 Nicotine dependence, cigarettes, uncomplicated: Secondary | ICD-10-CM | POA: Diagnosis not present

## 2018-01-18 DIAGNOSIS — E785 Hyperlipidemia, unspecified: Secondary | ICD-10-CM | POA: Diagnosis not present

## 2018-01-18 DIAGNOSIS — Z8249 Family history of ischemic heart disease and other diseases of the circulatory system: Secondary | ICD-10-CM | POA: Diagnosis not present

## 2018-01-18 HISTORY — DX: Adverse effect of unspecified anesthetic, initial encounter: T41.45XA

## 2018-01-18 HISTORY — DX: Other complications of anesthesia, initial encounter: T88.59XA

## 2018-01-18 HISTORY — DX: Headache: R51

## 2018-01-18 HISTORY — DX: Nontoxic single thyroid nodule: E04.1

## 2018-01-18 HISTORY — DX: Headache, unspecified: R51.9

## 2018-01-18 LAB — CBC WITH DIFFERENTIAL/PLATELET
BASOS PCT: 1 %
Basophils Absolute: 0.1 10*3/uL (ref 0.0–0.1)
EOS ABS: 0.2 10*3/uL (ref 0.0–0.7)
EOS PCT: 2 %
HCT: 46.3 % — ABNORMAL HIGH (ref 36.0–46.0)
Hemoglobin: 15.5 g/dL — ABNORMAL HIGH (ref 12.0–15.0)
Lymphocytes Relative: 39 %
Lymphs Abs: 5.1 10*3/uL — ABNORMAL HIGH (ref 0.7–4.0)
MCH: 30.8 pg (ref 26.0–34.0)
MCHC: 33.5 g/dL (ref 30.0–36.0)
MCV: 92 fL (ref 78.0–100.0)
MONO ABS: 0.9 10*3/uL (ref 0.1–1.0)
MONOS PCT: 7 %
Neutro Abs: 6.9 10*3/uL (ref 1.7–7.7)
Neutrophils Relative %: 53 %
PLATELETS: 358 10*3/uL (ref 150–400)
RBC: 5.03 MIL/uL (ref 3.87–5.11)
RDW: 13.9 % (ref 11.5–15.5)
WBC: 13.1 10*3/uL — ABNORMAL HIGH (ref 4.0–10.5)

## 2018-01-18 LAB — BASIC METABOLIC PANEL
Anion gap: 11 (ref 5–15)
BUN: 12 mg/dL (ref 6–20)
CALCIUM: 9.4 mg/dL (ref 8.9–10.3)
CO2: 24 mmol/L (ref 22–32)
CREATININE: 0.96 mg/dL (ref 0.44–1.00)
Chloride: 102 mmol/L (ref 101–111)
GFR calc Af Amer: >60 mL/min (ref >60)
GFR calc non Af Amer: >60 mL/min (ref >60)
GLUCOSE: 83 mg/dL (ref 65–99)
Potassium: 4 mmol/L (ref 3.5–5.1)
Sodium: 137 mmol/L (ref 135–145)

## 2018-01-18 LAB — SURGICAL PCR SCREEN
MRSA, PCR: NEGATIVE
STAPHYLOCOCCUS AUREUS: POSITIVE — AB
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
## 2018-01-18 NOTE — Anesthesia Preprocedure Evaluation (Addendum)
Anesthesia Evaluation  Patient identified by MRN, date of birth, ID band Patient awake    Reviewed: Allergy & Precautions, NPO status , Patient's Chart, lab work & pertinent test results  History of Anesthesia Complications (+) PSEUDOCHOLINESTERASE DEFICIENCY and history of anesthetic complications  Airway Mallampati: II  TM Distance: >3 FB Neck ROM: Full    Dental no notable dental hx. (+) Dental Advisory Given   Pulmonary Current Smoker,    Pulmonary exam normal        Cardiovascular negative cardio ROS Normal cardiovascular exam     Neuro/Psych  Headaches, negative neurological ROS  negative psych ROS   GI/Hepatic negative GI ROS, Neg liver ROS,   Endo/Other  negative endocrine ROS  Renal/GU negative Renal ROS     Musculoskeletal negative musculoskeletal ROS (+)   Abdominal   Peds  Hematology negative hematology ROS (+)   Anesthesia Other Findings Day of surgery medications reviewed with the patient.  Reproductive/Obstetrics                           Anesthesia Physical Anesthesia Plan  ASA: II  Anesthesia Plan: General   Post-op Pain Management:    Induction: Intravenous  PONV Risk Score and Plan:   Airway Management Planned: Oral ETT  Additional Equipment:   Intra-op Plan:   Post-operative Plan: Extubation in OR  Informed Consent: I have reviewed the patients History and Physical, chart, labs and discussed the procedure including the risks, benefits and alternatives for the proposed anesthesia with the patient or authorized representative who has indicated his/her understanding and acceptance.   Dental advisory given  Plan Discussed with: CRNA and Anesthesiologist  Anesthesia Plan Comments:        Anesthesia Quick Evaluation

## 2018-01-18 NOTE — Progress Notes (Signed)
PCP is Dr. Assunta Found  Denies ever seeing a cardiologist. Denies chest pain, cough, or fever. Ebony Hail, PA-C called and informed of order for consult.

## 2018-01-19 ENCOUNTER — Ambulatory Visit (HOSPITAL_COMMUNITY): Payer: Medicare HMO | Admitting: Certified Registered Nurse Anesthetist

## 2018-01-19 ENCOUNTER — Ambulatory Visit (HOSPITAL_COMMUNITY)
Admission: RE | Disposition: A | Discharge status: Home / Self Care | Payer: Self-pay | Source: Ambulatory Visit | Attending: Neurosurgery

## 2018-01-19 ENCOUNTER — Ambulatory Visit (HOSPITAL_COMMUNITY): Payer: Medicare HMO | Admitting: Vascular Surgery

## 2018-01-19 ENCOUNTER — Encounter (HOSPITAL_COMMUNITY): Payer: Self-pay | Admitting: *Deleted

## 2018-01-19 ENCOUNTER — Ambulatory Visit: Payer: Medicare HMO | Admitting: Pediatrics

## 2018-01-19 ENCOUNTER — Ambulatory Visit (HOSPITAL_COMMUNITY): Payer: Medicare HMO

## 2018-01-19 ENCOUNTER — Observation Stay (HOSPITAL_COMMUNITY)
Admission: RE | Admit: 2018-01-19 | Discharge: 2018-01-19 | Disposition: A | Discharge status: Home / Self Care | Payer: Medicare HMO | Source: Ambulatory Visit | Attending: Neurosurgery | Admitting: Neurosurgery

## 2018-01-19 DIAGNOSIS — E785 Hyperlipidemia, unspecified: Secondary | ICD-10-CM | POA: Diagnosis not present

## 2018-01-19 DIAGNOSIS — M4722 Other spondylosis with radiculopathy, cervical region: Secondary | ICD-10-CM | POA: Diagnosis not present

## 2018-01-19 DIAGNOSIS — Z981 Arthrodesis status: Secondary | ICD-10-CM | POA: Diagnosis not present

## 2018-01-19 DIAGNOSIS — M4723 Other spondylosis with radiculopathy, cervicothoracic region: Secondary | ICD-10-CM | POA: Diagnosis not present

## 2018-01-19 DIAGNOSIS — Z79899 Other long term (current) drug therapy: Secondary | ICD-10-CM | POA: Insufficient documentation

## 2018-01-19 DIAGNOSIS — R928 Other abnormal and inconclusive findings on diagnostic imaging of breast: Secondary | ICD-10-CM | POA: Diagnosis not present

## 2018-01-19 DIAGNOSIS — Z884 Allergy status to anesthetic agent status: Secondary | ICD-10-CM | POA: Insufficient documentation

## 2018-01-19 DIAGNOSIS — M4803 Spinal stenosis, cervicothoracic region: Secondary | ICD-10-CM | POA: Insufficient documentation

## 2018-01-19 DIAGNOSIS — M1990 Unspecified osteoarthritis, unspecified site: Secondary | ICD-10-CM | POA: Diagnosis not present

## 2018-01-19 DIAGNOSIS — Z882 Allergy status to sulfonamides status: Secondary | ICD-10-CM | POA: Insufficient documentation

## 2018-01-19 DIAGNOSIS — E8809 Other disorders of plasma-protein metabolism, not elsewhere classified: Secondary | ICD-10-CM | POA: Insufficient documentation

## 2018-01-19 DIAGNOSIS — F17210 Nicotine dependence, cigarettes, uncomplicated: Secondary | ICD-10-CM | POA: Insufficient documentation

## 2018-01-19 DIAGNOSIS — Z885 Allergy status to narcotic agent status: Secondary | ICD-10-CM | POA: Insufficient documentation

## 2018-01-19 DIAGNOSIS — Z419 Encounter for procedure for purposes other than remedying health state, unspecified: Secondary | ICD-10-CM

## 2018-01-19 DIAGNOSIS — M2578 Osteophyte, vertebrae: Secondary | ICD-10-CM | POA: Diagnosis not present

## 2018-01-19 DIAGNOSIS — M4802 Spinal stenosis, cervical region: Secondary | ICD-10-CM | POA: Insufficient documentation

## 2018-01-19 DIAGNOSIS — Z881 Allergy status to other antibiotic agents status: Secondary | ICD-10-CM | POA: Insufficient documentation

## 2018-01-19 DIAGNOSIS — Z8249 Family history of ischemic heart disease and other diseases of the circulatory system: Secondary | ICD-10-CM | POA: Insufficient documentation

## 2018-01-19 HISTORY — PX: POSTERIOR CERVICAL LAMINECTOMY: SHX2248

## 2018-01-19 LAB — POCT PREGNANCY, URINE: PREG TEST UR: NEGATIVE

## 2018-01-19 SURGERY — POSTERIOR CERVICAL LAMINECTOMY
Anesthesia: General | Site: Spine Cervical | Laterality: Left

## 2018-01-19 MED ORDER — DEXAMETHASONE SODIUM PHOSPHATE 10 MG/ML IJ SOLN
INTRAMUSCULAR | Status: DC | PRN
  Administered 2018-01-19: 10 mg via INTRAVENOUS

## 2018-01-19 MED ORDER — ONDANSETRON HCL 4 MG/2ML IJ SOLN
INTRAMUSCULAR | Status: AC
  Filled 2018-01-19: qty 2

## 2018-01-19 MED ORDER — ONDANSETRON HCL 4 MG PO TABS: 4.0000 mg | ORAL_TABLET | Freq: Four times a day (QID) | ORAL | Status: DC | PRN

## 2018-01-19 MED ORDER — HYDROMORPHONE HCL 1 MG/ML IJ SOLN
INTRAMUSCULAR | Status: AC
  Filled 2018-01-19: qty 1

## 2018-01-19 MED ORDER — HYDROCODONE-ACETAMINOPHEN 10-325 MG PO TABS
2.0000 | ORAL_TABLET | ORAL | Status: DC | PRN
  Administered 2018-01-19 (×2): 2 via ORAL
  Filled 2018-01-19 (×2): qty 2

## 2018-01-19 MED ORDER — ONDANSETRON HCL 4 MG/2ML IJ SOLN: 4.0000 mg | Freq: Four times a day (QID) | INTRAMUSCULAR | Status: DC | PRN

## 2018-01-19 MED ORDER — ACETAMINOPHEN 325 MG PO TABS: 650.0000 mg | ORAL_TABLET | ORAL | Status: DC | PRN

## 2018-01-19 MED ORDER — SUGAMMADEX SODIUM 200 MG/2ML IV SOLN
INTRAVENOUS | Status: DC | PRN
  Administered 2018-01-19: 200 mg via INTRAVENOUS

## 2018-01-19 MED ORDER — MENTHOL 3 MG MT LOZG: 1.0000 | LOZENGE | OROMUCOSAL | Status: DC | PRN

## 2018-01-19 MED ORDER — DEXAMETHASONE SODIUM PHOSPHATE 10 MG/ML IJ SOLN: 10.0000 mg | INTRAMUSCULAR | Status: DC

## 2018-01-19 MED ORDER — CYCLOBENZAPRINE HCL 10 MG PO TABS: 10.0000 mg | ORAL_TABLET | Freq: Three times a day (TID) | ORAL | Status: DC | PRN

## 2018-01-19 MED ORDER — PHENOL 1.4 % MT LIQD: 1.0000 | OROMUCOSAL | Status: DC | PRN

## 2018-01-19 MED ORDER — SODIUM CHLORIDE 0.9 % IV SOLN: 250.0000 mL | INTRAVENOUS | Status: DC

## 2018-01-19 MED ORDER — SODIUM CHLORIDE 0.9% FLUSH: 3.0000 mL | INTRAVENOUS | Status: DC | PRN

## 2018-01-19 MED ORDER — HYDROCODONE-ACETAMINOPHEN 5-325 MG PO TABS: 1.0000 | ORAL_TABLET | ORAL | Status: DC | PRN

## 2018-01-19 MED ORDER — FENTANYL CITRATE (PF) 250 MCG/5ML IJ SOLN
INTRAMUSCULAR | Status: AC
  Filled 2018-01-19: qty 5

## 2018-01-19 MED ORDER — THROMBIN (RECOMBINANT) 5000 UNITS EX SOLR
OROMUCOSAL | Status: DC | PRN
  Administered 2018-01-19: 09:00:00 via TOPICAL

## 2018-01-19 MED ORDER — ARTIFICIAL TEARS OPHTHALMIC OINT
TOPICAL_OINTMENT | OPHTHALMIC | Status: DC | PRN
  Administered 2018-01-19: 1 via OPHTHALMIC

## 2018-01-19 MED ORDER — SUGAMMADEX SODIUM 200 MG/2ML IV SOLN
INTRAVENOUS | Status: AC
  Filled 2018-01-19: qty 2

## 2018-01-19 MED ORDER — PROPOFOL 10 MG/ML IV BOLUS
INTRAVENOUS | Status: AC
  Filled 2018-01-19: qty 40

## 2018-01-19 MED ORDER — FLUOXETINE HCL 20 MG PO CAPS
20.0000 mg | ORAL_CAPSULE | Freq: Every day | ORAL | Status: DC
  Administered 2018-01-19: 20 mg via ORAL
  Filled 2018-01-19: qty 1

## 2018-01-19 MED ORDER — SODIUM CHLORIDE 0.9 % IR SOLN
Status: DC | PRN
  Administered 2018-01-19: 09:00:00

## 2018-01-19 MED ORDER — LACTATED RINGERS IV SOLN
INTRAVENOUS | Status: DC
  Administered 2018-01-19: 07:00:00 via INTRAVENOUS

## 2018-01-19 MED ORDER — PROMETHAZINE HCL 25 MG/ML IJ SOLN: 6.2500 mg | INTRAMUSCULAR | Status: DC | PRN

## 2018-01-19 MED ORDER — FENTANYL CITRATE (PF) 100 MCG/2ML IJ SOLN
INTRAMUSCULAR | Status: DC | PRN
  Administered 2018-01-19 (×5): 50 ug via INTRAVENOUS

## 2018-01-19 MED ORDER — CEFAZOLIN SODIUM-DEXTROSE 1-4 GM/50ML-% IV SOLN
1.0000 g | Freq: Three times a day (TID) | INTRAVENOUS | Status: DC
  Administered 2018-01-19: 1 g via INTRAVENOUS
  Filled 2018-01-19: qty 50

## 2018-01-19 MED ORDER — ACETAMINOPHEN 10 MG/ML IV SOLN
INTRAVENOUS | Status: AC
  Filled 2018-01-19: qty 100

## 2018-01-19 MED ORDER — DEXAMETHASONE SODIUM PHOSPHATE 10 MG/ML IJ SOLN
INTRAMUSCULAR | Status: AC
  Filled 2018-01-19: qty 1

## 2018-01-19 MED ORDER — ARTIFICIAL TEARS OPHTHALMIC OINT
TOPICAL_OINTMENT | OPHTHALMIC | Status: AC
  Filled 2018-01-19: qty 3.5

## 2018-01-19 MED ORDER — MIDAZOLAM HCL 2 MG/2ML IJ SOLN
INTRAMUSCULAR | Status: AC
  Filled 2018-01-19: qty 2

## 2018-01-19 MED ORDER — CHLORHEXIDINE GLUCONATE CLOTH 2 % EX PADS: 6.0000 | MEDICATED_PAD | Freq: Once | CUTANEOUS | Status: DC

## 2018-01-19 MED ORDER — CEFAZOLIN SODIUM-DEXTROSE 2-3 GM-%(50ML) IV SOLR
INTRAVENOUS | Status: DC | PRN
  Administered 2018-01-19: 2 g via INTRAVENOUS

## 2018-01-19 MED ORDER — 0.9 % SODIUM CHLORIDE (POUR BTL) OPTIME
TOPICAL | Status: DC | PRN
  Administered 2018-01-19: 1000 mL

## 2018-01-19 MED ORDER — HYDROMORPHONE HCL 1 MG/ML IJ SOLN
0.2500 mg | INTRAMUSCULAR | Status: DC | PRN
  Administered 2018-01-19 (×4): 0.5 mg via INTRAVENOUS

## 2018-01-19 MED ORDER — PRAVASTATIN SODIUM 40 MG PO TABS
80.0000 mg | ORAL_TABLET | Freq: Every day | ORAL | Status: DC
  Administered 2018-01-19: 80 mg via ORAL
  Filled 2018-01-19: qty 2

## 2018-01-19 MED ORDER — ONDANSETRON HCL 4 MG/2ML IJ SOLN
INTRAMUSCULAR | Status: DC | PRN
  Administered 2018-01-19: 4 mg via INTRAVENOUS

## 2018-01-19 MED ORDER — HEMOSTATIC AGENTS (NO CHARGE) OPTIME
TOPICAL | Status: DC | PRN
  Administered 2018-01-19: 1 via TOPICAL

## 2018-01-19 MED ORDER — CYCLOBENZAPRINE HCL 10 MG PO TABS: 10.0000 mg | ORAL_TABLET | Freq: Three times a day (TID) | ORAL | 0 refills | Status: DC | PRN

## 2018-01-19 MED ORDER — PROPOFOL 10 MG/ML IV BOLUS
INTRAVENOUS | Status: DC | PRN
  Administered 2018-01-19: 30 mg via INTRAVENOUS
  Administered 2018-01-19: 180 mg via INTRAVENOUS
  Administered 2018-01-19: 50 mg via INTRAVENOUS

## 2018-01-19 MED ORDER — KETOROLAC TROMETHAMINE 15 MG/ML IJ SOLN
30.0000 mg | Freq: Four times a day (QID) | INTRAMUSCULAR | Status: DC
  Administered 2018-01-19: 30 mg via INTRAVENOUS
  Filled 2018-01-19: qty 2

## 2018-01-19 MED ORDER — LIDOCAINE HCL (CARDIAC) 20 MG/ML IV SOLN
INTRAVENOUS | Status: DC | PRN
  Administered 2018-01-19: 100 mg via INTRAVENOUS

## 2018-01-19 MED ORDER — CEFAZOLIN SODIUM-DEXTROSE 2-4 GM/100ML-% IV SOLN: 2.0000 g | INTRAVENOUS | Status: DC

## 2018-01-19 MED ORDER — KETOROLAC TROMETHAMINE 30 MG/ML IJ SOLN
INTRAMUSCULAR | Status: DC | PRN
  Administered 2018-01-19: 30 mg via INTRAVENOUS

## 2018-01-19 MED ORDER — THROMBIN 5000 UNITS EX SOLR
CUTANEOUS | Status: DC | PRN
  Administered 2018-01-19 (×2): 5000 [IU] via TOPICAL

## 2018-01-19 MED ORDER — CEFAZOLIN SODIUM-DEXTROSE 2-4 GM/100ML-% IV SOLN
INTRAVENOUS | Status: AC
  Filled 2018-01-19: qty 100

## 2018-01-19 MED ORDER — BUPIVACAINE HCL (PF) 0.25 % IJ SOLN
INTRAMUSCULAR | Status: AC
Start: 2018-01-19 — End: 2018-01-19
  Filled 2018-01-19: qty 30

## 2018-01-19 MED ORDER — ACETAMINOPHEN 10 MG/ML IV SOLN
INTRAVENOUS | Status: DC | PRN
  Administered 2018-01-19: 1000 mg via INTRAVENOUS

## 2018-01-19 MED ORDER — THROMBIN 5000 UNITS EX SOLR
CUTANEOUS | Status: AC
  Filled 2018-01-19: qty 15000

## 2018-01-19 MED ORDER — BUPIVACAINE HCL (PF) 0.25 % IJ SOLN
INTRAMUSCULAR | Status: DC | PRN
  Administered 2018-01-19: 30 mL

## 2018-01-19 MED ORDER — ADULT MULTIVITAMIN W/MINERALS CH
1.0000 | ORAL_TABLET | Freq: Every day | ORAL | Status: DC
  Administered 2018-01-19: 1 via ORAL
  Filled 2018-01-19 (×2): qty 1

## 2018-01-19 MED ORDER — ROCURONIUM BROMIDE 100 MG/10ML IV SOLN
INTRAVENOUS | Status: DC | PRN
  Administered 2018-01-19: 80 mg via INTRAVENOUS

## 2018-01-19 MED ORDER — MIDAZOLAM HCL 5 MG/5ML IJ SOLN
INTRAMUSCULAR | Status: DC | PRN
  Administered 2018-01-19: 2 mg via INTRAVENOUS

## 2018-01-19 MED ORDER — BACITRACIN ZINC 500 UNIT/GM EX OINT
TOPICAL_OINTMENT | CUTANEOUS | Status: DC | PRN
  Administered 2018-01-19: 1 via TOPICAL

## 2018-01-19 MED ORDER — MUPIROCIN 2 % EX OINT: 1.0000 "application " | TOPICAL_OINTMENT | Freq: Two times a day (BID) | CUTANEOUS | Status: DC

## 2018-01-19 MED ORDER — BACITRACIN ZINC 500 UNIT/GM EX OINT
TOPICAL_OINTMENT | CUTANEOUS | Status: AC
  Filled 2018-01-19: qty 28.35

## 2018-01-19 MED ORDER — ACETAMINOPHEN 650 MG RE SUPP: 650.0000 mg | RECTAL | Status: DC | PRN

## 2018-01-19 MED ORDER — HYDROMORPHONE HCL 1 MG/ML IJ SOLN: 1.0000 mg | INTRAMUSCULAR | Status: DC | PRN

## 2018-01-19 MED ORDER — LIDOCAINE 2% (20 MG/ML) 5 ML SYRINGE
INTRAMUSCULAR | Status: AC
  Filled 2018-01-19: qty 5

## 2018-01-19 MED ORDER — SODIUM CHLORIDE 0.9% FLUSH: 3.0000 mL | Freq: Two times a day (BID) | INTRAVENOUS | Status: DC

## 2018-01-19 MED ORDER — ROCURONIUM BROMIDE 10 MG/ML (PF) SYRINGE
PREFILLED_SYRINGE | INTRAVENOUS | Status: AC
  Filled 2018-01-19: qty 5

## 2018-01-19 MED ORDER — HYDROCODONE-ACETAMINOPHEN 5-325 MG PO TABS: 1.0000 | ORAL_TABLET | ORAL | 0 refills | Status: DC | PRN

## 2018-01-19 SURGICAL SUPPLY — 50 items
BAG DECANTER FOR FLEXI CONT (MISCELLANEOUS) ×2 IMPLANT
BENZOIN TINCTURE PRP APPL 2/3 (GAUZE/BANDAGES/DRESSINGS) ×2 IMPLANT
BUR MATCHSTICK NEURO 3.0 LAGG (BURR) ×2 IMPLANT
CANISTER SUCT 3000ML PPV (MISCELLANEOUS) ×2 IMPLANT
CARTRIDGE OIL MAESTRO DRILL (MISCELLANEOUS) ×1 IMPLANT
DERMABOND ADVANCED (GAUZE/BANDAGES/DRESSINGS) ×1
DERMABOND ADVANCED .7 DNX12 (GAUZE/BANDAGES/DRESSINGS) ×1 IMPLANT
DIFFUSER DRILL AIR PNEUMATIC (MISCELLANEOUS) ×2 IMPLANT
DRAPE LAPAROTOMY 100X72 PEDS (DRAPES) ×2 IMPLANT
DRAPE MICROSCOPE LEICA (MISCELLANEOUS) ×2 IMPLANT
DRAPE POUCH INSTRU U-SHP 10X18 (DRAPES) ×2 IMPLANT
DRSG OPSITE POSTOP 3X4 (GAUZE/BANDAGES/DRESSINGS) ×2 IMPLANT
DURAPREP 26ML APPLICATOR (WOUND CARE) ×2 IMPLANT
ELECT REM PT RETURN 9FT ADLT (ELECTROSURGICAL) ×2
ELECTRODE REM PT RTRN 9FT ADLT (ELECTROSURGICAL) ×1 IMPLANT
GAUZE SPONGE 4X4 12PLY STRL (GAUZE/BANDAGES/DRESSINGS) ×2 IMPLANT
GAUZE SPONGE 4X4 16PLY XRAY LF (GAUZE/BANDAGES/DRESSINGS) IMPLANT
GLOVE BIO SURGEON STRL SZ8 (GLOVE) ×2 IMPLANT
GLOVE BIOGEL PI IND STRL 7.0 (GLOVE) ×3 IMPLANT
GLOVE BIOGEL PI IND STRL 7.5 (GLOVE) ×1 IMPLANT
GLOVE BIOGEL PI INDICATOR 7.0 (GLOVE) ×3
GLOVE BIOGEL PI INDICATOR 7.5 (GLOVE) ×1
GLOVE ECLIPSE 9.0 STRL (GLOVE) ×2 IMPLANT
GLOVE SS N UNI LF 6.5 STRL (GLOVE) ×6 IMPLANT
GOWN STRL REUS W/ TWL LRG LVL3 (GOWN DISPOSABLE) ×2 IMPLANT
GOWN STRL REUS W/ TWL XL LVL3 (GOWN DISPOSABLE) ×2 IMPLANT
GOWN STRL REUS W/TWL 2XL LVL3 (GOWN DISPOSABLE) IMPLANT
GOWN STRL REUS W/TWL LRG LVL3 (GOWN DISPOSABLE) ×2
GOWN STRL REUS W/TWL XL LVL3 (GOWN DISPOSABLE) ×2
HEMOSTAT POWDER KIT SURGIFOAM (HEMOSTASIS) ×2 IMPLANT
KIT BASIN OR (CUSTOM PROCEDURE TRAY) ×2 IMPLANT
KIT ROOM TURNOVER OR (KITS) ×2 IMPLANT
NEEDLE HYPO 22GX1.5 SAFETY (NEEDLE) ×2 IMPLANT
NEEDLE SPNL 22GX3.5 QUINCKE BK (NEEDLE) ×2 IMPLANT
NS IRRIG 1000ML POUR BTL (IV SOLUTION) ×2 IMPLANT
OIL CARTRIDGE MAESTRO DRILL (MISCELLANEOUS) ×2
PACK LAMINECTOMY NEURO (CUSTOM PROCEDURE TRAY) ×2 IMPLANT
PAD ARMBOARD 7.5X6 YLW CONV (MISCELLANEOUS) ×6 IMPLANT
PIN MAYFIELD SKULL DISP (PIN) ×2 IMPLANT
RUBBERBAND STERILE (MISCELLANEOUS) ×4 IMPLANT
SPONGE LAP 4X18 X RAY DECT (DISPOSABLE) IMPLANT
SPONGE SURGIFOAM ABS GEL SZ50 (HEMOSTASIS) ×2 IMPLANT
STRIP CLOSURE SKIN 1/2X4 (GAUZE/BANDAGES/DRESSINGS) ×2 IMPLANT
SUT VIC AB 0 CT1 18XCR BRD8 (SUTURE) ×1 IMPLANT
SUT VIC AB 0 CT1 8-18 (SUTURE) ×1
SUT VIC AB 2-0 CT1 18 (SUTURE) ×2 IMPLANT
SUT VIC AB 3-0 SH 8-18 (SUTURE) ×2 IMPLANT
TOWEL GREEN STERILE (TOWEL DISPOSABLE) ×2 IMPLANT
TOWEL GREEN STERILE FF (TOWEL DISPOSABLE) ×2 IMPLANT
WATER STERILE IRR 1000ML POUR (IV SOLUTION) ×2 IMPLANT

## 2018-01-19 NOTE — OR Nursing (Signed)
Patient arrived to or with bilateral tragus piercings. They were removed and place in bag in patient chart priot to surgery.

## 2018-01-19 NOTE — Anesthesia Postprocedure Evaluation (Signed)
Anesthesia Post Note  Patient: Emily Weeks  Procedure(s) Performed: Left Cervical Six-Seven, Cervical Seven-Thoracic One Laminectomy and Foraminotomy (Left Spine Cervical)     Patient location during evaluation: PACU Anesthesia Type: General Level of consciousness: sedated Pain management: pain level controlled Vital Signs Assessment: post-procedure vital signs reviewed and stable Respiratory status: spontaneous breathing and respiratory function stable Cardiovascular status: stable Postop Assessment: no apparent nausea or vomiting Anesthetic complications: no    Last Vitals:  Vitals:   01/19/18 1045 01/19/18 1124  BP:    Pulse: 78   Resp: (!) 8   Temp:  36.6 C  SpO2: 100%        LLE Sensation: Full sensation (01/19/18 1124)   RLE Sensation: Full sensation (01/19/18 1124)      Truda Staub DANIEL

## 2018-01-19 NOTE — Anesthesia Procedure Notes (Signed)
Procedure Name: Intubation Date/Time: 01/19/2018 8:08 AM Performed by: Candis Shine, CRNA Pre-anesthesia Checklist: Patient identified, Emergency Drugs available, Suction available and Patient being monitored Patient Re-evaluated:Patient Re-evaluated prior to induction Oxygen Delivery Method: Circle System Utilized Preoxygenation: Pre-oxygenation with 100% oxygen Induction Type: IV induction Ventilation: Mask ventilation without difficulty Laryngoscope Size: Mac and 3 Grade View: Grade I Tube type: Oral Tube size: 7.0 mm Number of attempts: 1 Airway Equipment and Method: Stylet Placement Confirmation: ETT inserted through vocal cords under direct vision,  positive ETCO2 and breath sounds checked- equal and bilateral Secured at: 22 cm Tube secured with: Tape Dental Injury: Teeth and Oropharynx as per pre-operative assessment

## 2018-01-19 NOTE — Discharge Summary (Signed)
Physician Discharge Summary  Patient ID: Emily Weeks MRN: [id number] DOB/AGE: 46-Apr-1972 46 y.o.  Admit date: 01/19/2018 Discharge date: 01/19/2018  Admission Diagnoses:  Discharge Diagnoses:  Active Problems:   Cervical spondylosis with radiculopathy   Discharged Condition: good  Hospital Course: Patient admitted to the hospital where she underwent uncomplicated 2 level posterior cervical decompression.  Postoperative doing very well.  Preoperative neck and upper extremity pain much improved.  Ready for discharge home.  Consults:   Significant Diagnostic Studies:   Treatments:   Discharge Exam: Blood pressure (!) 144/77, pulse 83, temperature 97.8 F (36.6 C), resp. rate 16, height 5' 3"  (1.6 m), weight 78 kg (172 lb), last menstrual period 12/18/2017, SpO2 94 %. Awake and alert.  Oriented and appropriate.  Cranial nerve function intact.  Motor and sensory function extremities normal.  Wound clean and dry.  Chest and abdomen benign.  Disposition: 01-Home or Self Care   Allergies as of 01/19/2018      Reactions   Morphine And Related Shortness Of Breath   Anesthesia S-i-60    UNSPECIFIED REACTION    Clindamycin/lincomycin Rash   Sulfa Antibiotics Rash      Medication List    TAKE these medications   cyclobenzaprine 10 MG tablet Commonly known as:  FLEXERIL Take 1 tablet (10 mg total) by mouth 3 (three) times daily as needed for muscle spasms.   FLUoxetine 20 MG capsule Commonly known as:  PROZAC Take 1 capsule (20 mg total) by mouth daily.   HYDROcodone-acetaminophen 5-325 MG tablet Commonly known as:  NORCO/VICODIN Take 1-2 tablets by mouth every 4 (four) hours as needed for moderate pain ((score 4 to 6)).   multivitamin-iron-minerals-folic acid chewable tablet Chew 1 tablet by mouth daily.   pravastatin 80 MG tablet Commonly known as:  PRAVACHOL TAKE 1 TABLET DAILY        Signed: Charlie Pitter 01/19/2018, 7:23 PM

## 2018-01-19 NOTE — Op Note (Signed)
Date of procedure: 01/19/2018  Date of dictation: Same  Service: Neurosurgery  Preoperative diagnosis: Left C6-7 spondylosis with radiculopathy, left C7-T1 spondylosis with radiculopathy  Postoperative diagnosis: Same  Procedure Name: Left C6-7 and left C7-T1 decompressive laminotomy with foraminotomies, microdissection  Surgeon:Tamula Morrical A.Michail Boyte, M.D.  Asst. Surgeon: Ronnald Ramp  Anesthesia: General  Indication: 47 year old female with intractable left upper extremity pain paresthesias and weakness consistent with a mixed lower cervical radiculopathy.  Workup demonstrates evidence of marked foraminal stenosis at C7-T1 on the left side and moderately severe foraminal stenosis at C6-7 on the left side.  Patient presents now for decompressive surgery in hopes of improving her symptoms.  Operative note: After induction of anesthesia, patient position prone onto bolsters with her head fixed in the [place] pin headrest in a slightly flexed position.  Patient's posterior cervical region prepped and draped sterilely.  Incision made overlying C6-7.  Dissection performed on the left.  Retractor placed.  X-ray taken.  Level confirmed.  Decompressive laminotomies then performed using high-speed drill and Kerrison rongeurs to remove the inferior aspect of the lamina of C6 the medial aspect of the C6-7 facet joint and the superior aspect of the C7 lamina.  Ligament flavum elevated and resected.  Exiting left C7 nerve root identified.  Foraminotomies completed along the course of the exiting C7 nerve root.  The disc space itself was inspected and found to be flat without obvious herniation.  Attention was then placed to the C7-T1 level.  Retractor was redirected one level caudally.  Laminotomy then performed using high-speed drill and Kerrison Rogers in a similar fashion.  The left-sided C8 nerve root identified.  Foraminotomies completed on the course.  Microscope once again used for microdissection of the spinal canal.   At the C7-T1 disc space was bulging severely with associated osteophyte causing marked compression of the inferior surface of the C8 nerve root.  The disc spaces incised.  Some soft tissue was removed using micro pituitaries but the calcified osteophyte was not resectable in that manner.  Using high-speed drill I carefully drilled away the osteophyte to fully decompress the left C8 nerve root.  At this point there is no evidence of injury to thecal sac or nerve roots.  The wound was then irrigated fanlike solution.  Gelfoam was placed topically for hemostasis.  Wounds and closed in layers with Vicryl sutures.  Steri-Strips and sterile dressing were applied.  No apparent complications.  Patient tolerated the procedure well and she returns to the recovery room postop.
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
## 2018-01-19 NOTE — Progress Notes (Signed)
Patient very anxious to go home, discharge instructions/education/AVS/Rx given to patient with husband at bedside and they both verbalized understanding. Incision site with minimal drainage, dressing changed per MD order. No swelling, no redness noted on incision site. Patient voiding well, ambulating well and MAE. Tolerated dinner well with no problems swallowing. Patient ambulated with NT to her transport.

## 2018-01-19 NOTE — Transfer of Care (Signed)
Immediate Anesthesia Transfer of Care Note  Patient: Emily Weeks  Procedure(s) Performed: Laminectomy and Foraminotomy - C6-C7 - C7-T1 - left (Left )  Patient Location: PACU  Anesthesia Type:General  Level of Consciousness: awake, alert  and oriented  Airway & Oxygen Therapy: Patient Spontanous Breathing and Patient connected to nasal cannula oxygen  Post-op Assessment: Report given to RN and Post -op Vital signs reviewed and stable  Post vital signs: Reviewed and stable  Last Vitals:  Vitals:   01/19/18 0629  BP: 119/64  Pulse: 64  Resp: 20  Temp: 36.6 C  SpO2: 97%    Last Pain:  Vitals:   01/19/18 0645  TempSrc:   PainSc: 8       Patients Stated Pain Goal: 2 (82/50/53 9767)  Complications: No apparent anesthesia complications

## 2018-01-19 NOTE — Brief Op Note (Signed)
01/19/2018  9:33 AM  PATIENT:  [name]  47 y.o. female  PRE-OPERATIVE DIAGNOSIS:  Stenosis  POST-OPERATIVE DIAGNOSIS:  * No post-op diagnosis entered *  PROCEDURE:  Procedure(s): Laminectomy and Foraminotomy - C6-C7 - C7-T1 - left (Left)  SURGEON:  Surgeon(s) and Role:    Earnie Larsson, MD - Primary  PHYSICIAN ASSISTANT:   ASSISTANTS: Ronnald Ramp   ANESTHESIA:   general  EBL:  25 mL   BLOOD ADMINISTERED:none  DRAINS: none   LOCAL MEDICATIONS USED:  MARCAINE     SPECIMEN:  No Specimen  DISPOSITION OF SPECIMEN:  N/A  COUNTS:  YES  TOURNIQUET:  * No tourniquets in log *  DICTATION: .Dragon Dictation  PLAN OF CARE: Admit for overnight observation  PATIENT DISPOSITION:  PACU - hemodynamically stable.   Delay start of Pharmacological VTE agent (>24hrs) due to surgical blood loss or risk of bleeding: yes

## 2018-01-19 NOTE — Discharge Instructions (Signed)

## 2018-01-20 ENCOUNTER — Encounter (HOSPITAL_COMMUNITY): Payer: Self-pay | Admitting: Neurosurgery

## 2018-01-20 ENCOUNTER — Ambulatory Visit: Payer: Medicare HMO | Admitting: Pediatrics

## 2018-01-20 MED FILL — Thrombin For Soln 5000 Unit: CUTANEOUS | Qty: 5000 | Status: AC

## 2018-01-21 DIAGNOSIS — H5213 Myopia, bilateral: Secondary | ICD-10-CM | POA: Diagnosis not present

## 2018-01-25 DIAGNOSIS — M4723 Other spondylosis with radiculopathy, cervicothoracic region: Secondary | ICD-10-CM | POA: Diagnosis not present

## 2018-01-25 DIAGNOSIS — M4802 Spinal stenosis, cervical region: Secondary | ICD-10-CM | POA: Diagnosis not present

## 2018-01-25 DIAGNOSIS — M4722 Other spondylosis with radiculopathy, cervical region: Secondary | ICD-10-CM | POA: Diagnosis not present

## 2018-01-25 DIAGNOSIS — M4803 Spinal stenosis, cervicothoracic region: Secondary | ICD-10-CM | POA: Diagnosis not present

## 2018-04-20 ENCOUNTER — Encounter: Payer: Self-pay | Admitting: Pediatrics

## 2018-04-20 ENCOUNTER — Ambulatory Visit (INDEPENDENT_AMBULATORY_CARE_PROVIDER_SITE_OTHER): Payer: Medicare HMO | Admitting: Pediatrics

## 2018-04-20 VITALS — BP 125/74 | HR 62 | Temp 97.6°F | Ht 63.0 in | Wt 179.6 lb

## 2018-04-20 DIAGNOSIS — R232 Flushing: Secondary | ICD-10-CM

## 2018-04-20 DIAGNOSIS — E785 Hyperlipidemia, unspecified: Secondary | ICD-10-CM | POA: Diagnosis not present

## 2018-04-20 DIAGNOSIS — J3489 Other specified disorders of nose and nasal sinuses: Secondary | ICD-10-CM | POA: Diagnosis not present

## 2018-04-20 LAB — LIPID PANEL
CHOLESTEROL TOTAL: 248 mg/dL — AB (ref 100–199)
Chol/HDL Ratio: 6.9 ratio — ABNORMAL HIGH (ref 0.0–4.4)
HDL: 36 mg/dL — ABNORMAL LOW (ref >39)
LDL CALC: 167 mg/dL — AB (ref 0–99)
Triglycerides: 224 mg/dL — ABNORMAL HIGH (ref 0–149)
VLDL Cholesterol Cal: 45 mg/dL — ABNORMAL HIGH (ref 5–40)

## 2018-04-20 MED ORDER — PRAVASTATIN SODIUM 80 MG PO TABS: 80.0000 mg | ORAL_TABLET | Freq: Every day | ORAL | 3 refills | Status: DC

## 2018-04-20 MED ORDER — FLUOXETINE HCL 20 MG PO CAPS
20.0000 mg | ORAL_CAPSULE | Freq: Every day | ORAL | 3 refills | Status: DC
Start: 2018-04-20 — End: 2019-04-05

## 2018-04-20 NOTE — Patient Instructions (Signed)
[email or web address]  Address: [address], [place], [place] [address] Hours:  Open ? Closes 5PM  Phone: [phone]  [email or web address] Address: [address] [place], [place], [place] [address] Hours:  Open ? Closes 5PM  Phone: [phone]

## 2018-04-20 NOTE — Progress Notes (Signed)
  Subjective:   Patient ID: Emily Weeks, female    DOB: 05-Jun-1971, 47 y.o.   MRN: [id number] CC: Medical Management of Chronic Issues  HPI: Emily Weeks is a 47 y.o. female   Hot flashes: Improved.  Taking SSRI regularly.  Abnormal mammogram: Was scheduled to have ultrasound, so she was told she would need to pay for it rather than having it covered by insurance so she declined at the time.  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  Has been recovering well from recent neck surgery. Says the symptoms much improved.  Feeling in her left arm much improved.  Pain much improved.  For last 2 months she has had worsening pain inside left nare, upper part.  When she gently blows or rubs it sometimes pain is very intense.  No bleeding.  She thinks it felt like a pimple when it first started, has not gotten better.  Relevant past medical, surgical, family and social history reviewed. Allergies and medications reviewed and updated. Social History   Tobacco Use  Smoking Status Current Every Day Smoker  . Packs/day: 0.50  . Types: Cigarettes  . Start date: 12/23/1986  Smokeless Tobacco Never Used   ROS: Per HPI   Objective:    BP 125/74   Pulse 62   Temp 97.6 F (36.4 C) (Oral)   Ht 5' 3"  (1.6 m)   Wt 179 lb 9.6 oz (81.5 kg)   BMI 31.81 kg/m   Wt Readings from Last 3 Encounters:  04/20/18 179 lb 9.6 oz (81.5 kg)  01/19/18 172 lb (78 kg)  01/18/18 172 lb 14.4 oz (78.4 kg)    Gen: NAD, alert, cooperative with exam, NCAT EYES: EOMI, no conjunctival injection, or no icterus ENT:  TMs pearly gray b/l, OP without erythema.  No redness inside left nare. LYMPH: no cervical LAD CV: NRRR, normal S1/S2, no murmur, distal pulses 2+ b/l Resp: CTABL, no wheezes, normal WOB Abd: +BS, soft, NTND.  Ext: No edema, warm Neuro: Alert and oriented MSK: Missing some of right hand.  Assessment & Plan:  Emily Weeks was seen today for  medical management of chronic issues.  Diagnoses and all orders for this visit:  Hyperlipidemia, unspecified hyperlipidemia type Tolerating statin well, due for repeat lipid panel. -     pravastatin (PRAVACHOL) 80 MG tablet; Take 1 tablet (80 mg total) by mouth daily. -     Lipid panel  Hot flashes Stable, continue below -     FLUoxetine (PROZAC) 20 MG capsule; Take 1 capsule (20 mg total) by mouth daily.  Nose pain -     Ambulatory referral to ENT  Abnormal mammogram Overdue for six-month follow-up ultrasound.  Follow up plan: 6 months, sooner if needed Assunta Found, MD [place]

## 2018-04-21 ENCOUNTER — Other Ambulatory Visit: Payer: Self-pay

## 2018-04-21 ENCOUNTER — Encounter: Payer: Self-pay | Admitting: Pediatrics

## 2018-04-21 MED ORDER — ATORVASTATIN CALCIUM 80 MG PO TABS: 80.0000 mg | ORAL_TABLET | Freq: Every day | ORAL | 3 refills | Status: DC

## 2018-04-24 ENCOUNTER — Encounter: Payer: Self-pay | Admitting: *Deleted

## 2018-05-05 ENCOUNTER — Ambulatory Visit: Payer: Medicare HMO | Admitting: Pediatrics

## 2018-05-10 ENCOUNTER — Encounter: Payer: Self-pay | Admitting: Pediatrics

## 2018-05-11 ENCOUNTER — Other Ambulatory Visit: Payer: Self-pay | Admitting: Pediatrics

## 2018-05-11 ENCOUNTER — Ambulatory Visit: Payer: Medicare HMO | Admitting: Pediatrics

## 2018-05-11 DIAGNOSIS — N6310 Unspecified lump in the right breast, unspecified quadrant: Secondary | ICD-10-CM

## 2018-05-16 ENCOUNTER — Encounter: Payer: Self-pay | Admitting: *Deleted

## 2018-05-16 DIAGNOSIS — S68011A Complete traumatic metacarpophalangeal amputation of right thumb, initial encounter: Secondary | ICD-10-CM | POA: Insufficient documentation

## 2018-05-29 DIAGNOSIS — H902 Conductive hearing loss, unspecified: Secondary | ICD-10-CM | POA: Diagnosis not present

## 2018-05-29 DIAGNOSIS — M26623 Arthralgia of bilateral temporomandibular joint: Secondary | ICD-10-CM | POA: Diagnosis not present

## 2018-05-29 DIAGNOSIS — J3489 Other specified disorders of nose and nasal sinuses: Secondary | ICD-10-CM | POA: Diagnosis not present

## 2018-06-05 ENCOUNTER — Other Ambulatory Visit: Payer: Self-pay | Admitting: Pharmacist

## 2018-06-05 DIAGNOSIS — S68011A Complete traumatic metacarpophalangeal amputation of right thumb, initial encounter: Secondary | ICD-10-CM | POA: Diagnosis not present

## 2018-06-05 NOTE — Patient Outreach (Signed)
[facility]) Care Management  06/05/2018  Emily Weeks 1971/02/17 [id number]   Incoming call from [name] in response to the [facility] Medication Adherence Campaign. Speak with patient. HIPAA identifiers verified and verbal consent received.  [name] reports that she has been taking pravastatin once daily as directed and that she just recently picked up a refill of this medication from her pharmacy. However, reports that following her last PCP visit, she recalls being called by her PCP's office to let her know that she was being switched to a different cholesterol medication to further lower her cholesterol. Perform Epic chart review and per result notes from Lipid panel from 04/20/18, Dr. Evette Doffing switched the patient from pravastatin to atorvastatin and called this new prescription into the patient's pharmacy.   [name] reports that she will call her pharmacy now to have the atorvastatin filled and that she will start taking this in place of the pravastatin. Counsel patient on the importance of medication adherence. [name] denies ever missing a dose of her medication.  Patient denies any further medication questions/concerns at this time. Will close pharmacy episode.  Harlow Asa, PharmD, [facility] [phone]

## 2018-06-08 ENCOUNTER — Other Ambulatory Visit: Payer: Self-pay | Admitting: Pediatrics

## 2018-06-08 DIAGNOSIS — N6310 Unspecified lump in the right breast, unspecified quadrant: Secondary | ICD-10-CM

## 2018-07-03 ENCOUNTER — Other Ambulatory Visit: Payer: Self-pay | Admitting: Pediatrics

## 2018-07-03 DIAGNOSIS — N6310 Unspecified lump in the right breast, unspecified quadrant: Secondary | ICD-10-CM

## 2018-07-04 ENCOUNTER — Ambulatory Visit (HOSPITAL_COMMUNITY)
Admission: RE | Admit: 2018-07-04 | Discharge: 2018-07-04 | Disposition: A | Discharge status: Home / Self Care | Payer: Medicare HMO | Source: Ambulatory Visit | Attending: Pediatrics | Admitting: Pediatrics

## 2018-07-04 ENCOUNTER — Encounter (HOSPITAL_COMMUNITY): Payer: Self-pay | Admitting: Radiology

## 2018-07-04 DIAGNOSIS — R922 Inconclusive mammogram: Secondary | ICD-10-CM | POA: Diagnosis not present

## 2018-07-04 DIAGNOSIS — N6310 Unspecified lump in the right breast, unspecified quadrant: Secondary | ICD-10-CM | POA: Diagnosis not present

## 2018-07-05 ENCOUNTER — Encounter: Payer: Self-pay | Admitting: *Deleted

## 2018-07-05 ENCOUNTER — Ambulatory Visit (INDEPENDENT_AMBULATORY_CARE_PROVIDER_SITE_OTHER): Payer: Medicare HMO | Admitting: *Deleted

## 2018-07-05 VITALS — BP 124/70 | HR 55 | Ht 63.0 in | Wt 182.0 lb

## 2018-07-05 DIAGNOSIS — Z23 Encounter for immunization: Secondary | ICD-10-CM

## 2018-07-05 DIAGNOSIS — Z0000 Encounter for general adult medical examination without abnormal findings: Secondary | ICD-10-CM

## 2018-07-05 NOTE — Patient Instructions (Addendum)
Please continue the great work on your goal of quitting smoking.    You received your tetanus vaccine today- this vaccine is good for 10 years.   Thank you for coming in for your Annual Wellness Visit today!  Preventive Care 46-64 Years, Female Preventive care refers to lifestyle choices and visits with your health care provider that can promote health and wellness. What does preventive care include?  A yearly physical exam. This is also called an annual well check.  Dental exams once or twice a year.  Routine eye exams. Ask your health care provider how often you should have your eyes checked.  Personal lifestyle choices, including: ? Daily care of your teeth and gums. ? Regular physical activity. ? Eating a healthy diet. ? Avoiding tobacco and drug use. ? Limiting alcohol use. ? Practicing safe sex. ? Taking low-dose aspirin daily starting at age 46. ? Taking vitamin and mineral supplements as recommended by your health care provider. What happens during an annual well check? The services and screenings done by your health care provider during your annual well check will depend on your age, overall health, lifestyle risk factors, and family history of disease. Counseling Your health care provider may ask you questions about your:  Alcohol use.  Tobacco use.  Drug use.  Emotional well-being.  Home and relationship well-being.  Sexual activity.  Eating habits.  Work and work environment.  Method of birth control.  Menstrual cycle.  Pregnancy history.  Screening You may have the following tests or measurements:  Height, weight, and BMI.  Blood pressure.  Lipid and cholesterol levels. These may be checked every 5 years, or more frequently if you are over 46 years old.  Skin check.  Lung cancer screening. You may have this screening every year starting at age 46 if you have a 30-pack-year history of smoking and currently smoke or have quit within the past  15 years.  Fecal occult blood test (FOBT) of the stool. You may have this test every year starting at age 46.  Flexible sigmoidoscopy or colonoscopy. You may have a sigmoidoscopy every 5 years or a colonoscopy every 10 years starting at age 46.  Hepatitis C blood test.  Hepatitis B blood test.  Sexually transmitted disease (STD) testing.  Diabetes screening. This is done by checking your blood sugar (glucose) after you have not eaten for a while (fasting). You may have this done every 1-3 years.  Mammogram. This may be done every 1-2 years. Talk to your health care provider about when you should start having regular mammograms. This may depend on whether you have a family history of breast cancer.  BRCA-related cancer screening. This may be done if you have a family history of breast, ovarian, tubal, or peritoneal cancers.  Pelvic exam and Pap test. This may be done every 3 years starting at age 46. Starting at age 46, this may be done every 5 years if you have a Pap test in combination with an HPV test.  Bone density scan. This is done to screen for osteoporosis. You may have this scan if you are at high risk for osteoporosis.  Discuss your test results, treatment options, and if necessary, the need for more tests with your health care provider. Vaccines Your health care provider may recommend certain vaccines, such as:  Influenza vaccine. This is recommended every year.  Tetanus, diphtheria, and acellular pertussis (Tdap, Td) vaccine. You may need a Td booster every 10 years.  Varicella vaccine.   You may need this if you have not been vaccinated.  Zoster vaccine. You may need this after age 46.  Measles, mumps, and rubella (MMR) vaccine. You may need at least one dose of MMR if you were born in 1957 or later. You may also need a second dose.  Pneumococcal 13-valent conjugate (PCV13) vaccine. You may need this if you have certain conditions and were not previously  vaccinated.  Pneumococcal polysaccharide (PPSV23) vaccine. You may need one or two doses if you smoke cigarettes or if you have certain conditions.  Meningococcal vaccine. You may need this if you have certain conditions.  Hepatitis A vaccine. You may need this if you have certain conditions or if you travel or work in places where you may be exposed to hepatitis A.  Hepatitis B vaccine. You may need this if you have certain conditions or if you travel or work in places where you may be exposed to hepatitis B.  Haemophilus influenzae type b (Hib) vaccine. You may need this if you have certain conditions.  Talk to your health care provider about which screenings and vaccines you need and how often you need them. This information is not intended to replace advice given to you by your health care provider. Make sure you discuss any questions you have with your health care provider. Document Released: 12/05/2015 Document Revised: 07/28/2016 Document Reviewed: 09/09/2015 Elsevier Interactive Patient Education  [employer].
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
## 2018-08-17 ENCOUNTER — Ambulatory Visit (INDEPENDENT_AMBULATORY_CARE_PROVIDER_SITE_OTHER): Payer: Medicare HMO | Admitting: Pediatrics

## 2018-08-17 ENCOUNTER — Encounter: Payer: Self-pay | Admitting: Pediatrics

## 2018-08-17 VITALS — BP 128/84 | HR 66 | Temp 97.2°F | Ht 63.0 in | Wt 186.0 lb

## 2018-08-17 DIAGNOSIS — Z23 Encounter for immunization: Secondary | ICD-10-CM | POA: Diagnosis not present

## 2018-08-17 DIAGNOSIS — K047 Periapical abscess without sinus: Secondary | ICD-10-CM

## 2018-08-17 MED ORDER — HYDROCODONE-ACETAMINOPHEN 5-325 MG PO TABS: 1.0000 | ORAL_TABLET | Freq: Two times a day (BID) | ORAL | 0 refills | Status: DC | PRN

## 2018-08-17 MED ORDER — AMOXICILLIN-POT CLAVULANATE 875-125 MG PO TABS
1.0000 | ORAL_TABLET | Freq: Two times a day (BID) | ORAL | 0 refills | Status: DC
Start: 2018-08-17 — End: 2018-10-23

## 2018-08-17 NOTE — Patient Instructions (Signed)
Dental Clinic Operates on select Wednesday nights from 6:00-9:00PM at the [facility] and sees patients on a walk-in lottery system (no appointments). Patients should arrive between 5:15 and 5:30PM. [facility] Ground Floor, [place] [address] [place], [place] [address]  The Dental [facility] clinic will operate on the following 10 dates from 6-9 PM: . 8/28  . 9/4  . 9/11  . 9/18  . 9/25  . 10/2  . 10/9  . 10/16  . 10/23  . 11/6 November 13th and 20th will serve as make up dates if any of the above clinics are cancelled for any reason.  The Dental clinic provides the following free services: Marland Kitchen Screenings/Diagnosis  . Cleanings  . Fillings  . Extractions Attention prospective patients:  . Please bring a list of your current medications.  . If you are diabetic, please know your most recent A1C.  . If you are asthmatic please bring your inhaler.  . If you are taking an anticoagulant and need extractions please bring a note from your primary care physicians that your INR is stable.  . Please note: if you have an elevated blood pressure or blood glucose level we will not be able to treat you in our clinic. Frequently Asked Questions  For complete dental care, apply to be a patient at the dental school via the [facility]. [employer] students comply with HIPAA regulations and patient medical records are maintained according to accepted confidentiality standards. For questions related to the dental clinic, please contact [email or web address].   If you are in severe pain, have bleeding or swelling: Call the [facility] Urgent Care Department at [phone] between 8AM and 5PM. The visit has a modest fee and they can address your problem.

## 2018-08-17 NOTE — Progress Notes (Signed)
  Subjective:   Patient ID: Emily Weeks, female    DOB: 13-Sep-1971, 47 y.o.   MRN: [id number] CC: Dental Pain and Copper taste  HPI: Chetara Kropp is a 47 y.o. female   For the last 2 weeks she has had worsening pain in her lower four bottom front teeth.  She says she has had problems with her gums in this area before before.  Now all of them feel slightly loose, she can wiggle them when she pushes on them.  They are very sore.  She gets a coppery blood taste when she pushes on them.  Pain in the area is constant.  She has an appointment to see her dentist over 2 weeks from now, was the soonest that she could get.  No fevers.  Appetite is down because eating hurts because the tooth pain.  Relevant past medical, surgical, family and social history reviewed. Allergies and medications reviewed and updated. Social History   Tobacco Use  Smoking Status Former Smoker  . Packs/day: 0.50  . Types: Cigarettes  . Start date: 12/23/1986  Smokeless Tobacco Never Used  Tobacco Comment   Switched to E-cigarette 06/26/18, weaning off nicotine decreasing weekly   ROS: Per HPI   Objective:    BP 128/84   Pulse 66   Temp (!) 97.2 F (36.2 C) (Oral)   Ht 5' 3"  (1.6 m)   Wt 186 lb (84.4 kg)   BMI 32.95 kg/m   Wt Readings from Last 3 Encounters:  08/17/18 186 lb (84.4 kg)  07/05/18 182 lb (82.6 kg)  04/20/18 179 lb 9.6 oz (81.5 kg)    Gen: NAD, alert, cooperative with exam, NCAT EYES: EOMI, no conjunctival injection, or no icterus ENT:   OP without erythema, bottom four front teeth slightly loose.  Tender along the gumline buccal surface and inside surface of gums.  No fluctuance or abscess LYMPH: no cervical LAD CV: NRRR, normal S1/S2, no murmur, distal pulses 2+ b/l Resp: CTABL, no wheezes, normal WOB Ext: No edema, warm Neuro: Alert and oriented  Assessment & Plan:  Emilygrace was seen today for dental pain and copper taste.  Diagnoses and all orders for this visit:  Tooth  infection Treatment below.  Information given dental clinic, may be able to get in sooner.  Patient is going to try to call her dentist as well to get a sooner appointment.  Given amount of pain discomfort patient in, #10 tabs of hydrocodone given.  She has not had a reaction to this medicine in the past.  Do not take and drive, can cause drowsiness.  Must be seen for more refills. -     amoxicillin-clavulanate (AUGMENTIN) 875-125 MG tablet; Take 1 tablet by mouth 2 (two) times daily. -     HYDROcodone-acetaminophen (NORCO/VICODIN) 5-325 MG tablet; Take 1 tablet by mouth every 12 (twelve) hours as needed for moderate pain.  Need for immunization against influenza -     Flu Vaccine QUAD 36+ mos IM   Follow up plan: Return if symptoms worsen or fail to improve. Assunta Found, MD [place]

## 2018-08-18 ENCOUNTER — Encounter: Payer: Self-pay | Admitting: Pediatrics

## 2018-10-23 ENCOUNTER — Ambulatory Visit (INDEPENDENT_AMBULATORY_CARE_PROVIDER_SITE_OTHER): Payer: Medicare HMO | Admitting: Pediatrics

## 2018-10-23 ENCOUNTER — Encounter: Payer: Self-pay | Admitting: Pediatrics

## 2018-10-23 VITALS — BP 112/74 | HR 69 | Temp 97.5°F | Ht 63.0 in | Wt 193.0 lb

## 2018-10-23 DIAGNOSIS — R0683 Snoring: Secondary | ICD-10-CM | POA: Diagnosis not present

## 2018-10-23 DIAGNOSIS — R232 Flushing: Secondary | ICD-10-CM | POA: Diagnosis not present

## 2018-10-23 DIAGNOSIS — G4710 Hypersomnia, unspecified: Secondary | ICD-10-CM

## 2018-10-23 DIAGNOSIS — E785 Hyperlipidemia, unspecified: Secondary | ICD-10-CM

## 2018-10-23 DIAGNOSIS — Z6834 Body mass index (BMI) 34.0-34.9, adult: Secondary | ICD-10-CM | POA: Diagnosis not present

## 2018-10-23 DIAGNOSIS — R635 Abnormal weight gain: Secondary | ICD-10-CM

## 2018-10-23 NOTE — Progress Notes (Signed)
  Subjective:   Patient ID: Emily Weeks, female    DOB: 24-Jan-1971, 47 y.o.   MRN: [id number] CC: Medical Management of Chronic Issues  HPI: Emily Weeks is a 47 y.o. female   Feeling very tired throughout the day.  Getting about 8 hours of sleep at night.  No trouble sleeping.  Often takes 2 naps for 1 to 2 hours during the day.  No trouble sleeping at night.  Drinking 4 to 6 cups of coffee a day to stay awake.  She says this is been going on for years.  She usually falls asleep while watching TV or sitting during the day.  She does not fall asleep while driving.  She says she has been told she snores loudly, she has never had pauses with her breathing at night that she knows of.  Hot flashes: Improved.  She think she has had one in the last few months.  Last menstrual period was in April.  She stopped the Prozac 2 to 3 months ago.  Has not made it any worse.  Hyperlipidemia: Taking atorvastatin, sometimes half tab rather than full tab because of achiness in joints.  She has noticed weight gain over the last few years.  Normal thyroid level in 04/2017.  Appetite has been normal.  Eating 3 meals daily.    She remains cigarette-free for the last 6 months.  Rarely using e-cigarettes.  Relevant past medical, surgical, family and social history reviewed. Allergies and medications reviewed and updated. Social History   Tobacco Use  Smoking Status Former Smoker  . Packs/day: 0.50  . Types: Cigarettes  . Start date: 12/23/1986  Smokeless Tobacco Never Used  Tobacco Comment   Switched to E-cigarette 06/26/18, weaning off nicotine decreasing weekly   ROS: Per HPI   Objective:    BP 112/74   Pulse 69   Temp (!) 97.5 F (36.4 C) (Oral)   Ht 5' 3"  (1.6 m)   Wt 193 lb (87.5 kg)   BMI 34.19 kg/m   Wt Readings from Last 3 Encounters:  10/23/18 193 lb (87.5 kg)  08/17/18 186 lb (84.4 kg)  07/05/18 182 lb (82.6 kg)    Gen: NAD, alert, cooperative with exam, NCAT EYES: EOMI, no  conjunctival injection, or no icterus ENT: Right TM dull, left TM normal pearly gray with clear effusion, normal light reflex, OP without erythema LYMPH: no cervical LAD CV: NRRR, normal S1/S2, no murmur, distal pulses 2+ b/l Resp: CTABL, no wheezes, normal WOB Ext: No edema, warm Neuro: Alert and oriented  Assessment & Plan:  Silas was seen today for medical management of chronic issues.  Diagnoses and all orders for this visit:  Snoring -     Ambulatory referral to Neurology  Excessive sleepiness -     Ambulatory referral to Neurology  Hot flashes Minimally bothering her now.  Will let me know if becomes a problem in the future.  Weight gain BMI 34 Continue lifestyle changes, increasing fruit and vegetable intake, decreasing red meats, carbohydrates and sugary foods and beverages.  We will have her see neurology as above for sleep apnea evaluation.  Hyperlipidemia, unspecified hyperlipidemia type Continue atorvastatin  Follow up plan: Return in about 6 months (around 04/24/2019). Assunta Found, MD [place]

## 2018-10-31 ENCOUNTER — Institutional Professional Consult (permissible substitution): Payer: Medicare HMO | Admitting: Neurology

## 2019-01-10 ENCOUNTER — Ambulatory Visit (INDEPENDENT_AMBULATORY_CARE_PROVIDER_SITE_OTHER): Payer: Medicare HMO | Admitting: Family Medicine

## 2019-01-10 ENCOUNTER — Encounter: Payer: Self-pay | Admitting: *Deleted

## 2019-01-10 ENCOUNTER — Encounter: Payer: Self-pay | Admitting: Family Medicine

## 2019-01-10 VITALS — BP 134/76 | HR 82 | Temp 97.3°F | Ht 63.0 in | Wt 196.0 lb

## 2019-01-10 DIAGNOSIS — M25471 Effusion, right ankle: Secondary | ICD-10-CM

## 2019-01-10 DIAGNOSIS — M7989 Other specified soft tissue disorders: Secondary | ICD-10-CM | POA: Diagnosis not present

## 2019-01-10 DIAGNOSIS — R1011 Right upper quadrant pain: Secondary | ICD-10-CM

## 2019-01-10 DIAGNOSIS — K760 Fatty (change of) liver, not elsewhere classified: Secondary | ICD-10-CM

## 2019-01-10 DIAGNOSIS — L600 Ingrowing nail: Secondary | ICD-10-CM

## 2019-01-10 DIAGNOSIS — M25472 Effusion, left ankle: Secondary | ICD-10-CM | POA: Diagnosis not present

## 2019-01-10 DIAGNOSIS — M65312 Trigger thumb, left thumb: Secondary | ICD-10-CM | POA: Diagnosis not present
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
## 2019-01-10 NOTE — Patient Instructions (Signed)
Trigger Finger  Trigger finger (stenosing tenosynovitis) is a condition that causes a finger to get stuck in a bent position. Each finger has a tough, cord-like tissue that connects muscle to bone (tendon), and each tendon is surrounded by a tunnel of tissue (tendon sheath). To move your finger, your tendon needs to slide freely through the sheath. Trigger finger happens when the tendon or the sheath thickens, making it difficult to move your finger. Trigger finger can affect any finger or a thumb. It may affect more than one finger. Mild cases may clear up with rest and medicine. Severe cases require more treatment. What are the causes? Trigger finger is caused by a thickened finger tendon or tendon sheath. The cause of this thickening is not known. What increases the risk? The following factors may make you more likely to develop this condition:  Doing activities that require a strong grip.  Having rheumatoid arthritis, gout, or diabetes.  Being 38-54 years old.  Being a woman. What are the signs or symptoms? Symptoms of this condition include:  Pain when bending or straightening your finger.  Tenderness or swelling where your finger attaches to the palm of your hand.  A lump in the palm of your hand or on the inside of your finger.  Hearing a popping sound when you try to straighten your finger.  Feeling a popping, catching, or locking sensation when you try to straighten your finger.  Being unable to straighten your finger. How is this diagnosed? This condition is diagnosed based on your symptoms and a physical exam. How is this treated? This condition may be treated by:  Resting your finger and avoiding activities that make symptoms worse.  Wearing a finger splint to keep your finger in a slightly bent position.  Taking NSAIDs to relieve pain and swelling.  Injecting medicine (steroids) into the tendon sheath to reduce swelling and irritation. Injections may need to be  repeated.  Having surgery to open the tendon sheath. This may be done if other treatments do not work and you cannot straighten your finger. You may need physical therapy after surgery. Follow these instructions at home:   Use moist heat to help reduce pain and swelling as told by your health care provider.  Rest your finger and avoid activities that make pain worse. Return to normal activities as told by your health care provider.  If you have a splint, wear it as told by your health care provider.  Take over-the-counter and prescription medicines only as told by your health care provider.  Keep all follow-up visits as told by your health care provider. This is important. Contact a health care provider if:  Your symptoms are not improving with home care. Summary  Trigger finger (stenosing tenosynovitis) causes your finger to get stuck in a bent position, and it can make it difficult and painful to straighten your finger.  This condition develops when a finger tendon or tendon sheath thickens.  Treatment starts with resting, wearing a splint, and taking NSAIDs.  In severe cases, surgery to open the tendon sheath may be needed. This information is not intended to replace advice given to you by your health care provider. Make sure you discuss any questions you have with your health care provider. Document Released: 08/28/2004 Document Revised: 10/19/2016 Document Reviewed: 10/19/2016 Elsevier Interactive Patient Education  2019 [employer].

## 2019-01-11 LAB — CMP14+EGFR
ALBUMIN: 4.5 g/dL (ref 3.8–4.8)
ALK PHOS: 72 IU/L (ref 39–117)
ALT: 31 IU/L (ref 0–32)
AST: 17 IU/L (ref 0–40)
Albumin/Globulin Ratio: 2 (ref 1.2–2.2)
BUN / CREAT RATIO: 11 (ref 9–23)
BUN: 11 mg/dL (ref 6–24)
Bilirubin Total: 0.4 mg/dL (ref 0.0–1.2)
CALCIUM: 9.5 mg/dL (ref 8.7–10.2)
CO2: 23 mmol/L (ref 20–29)
CREATININE: 0.96 mg/dL (ref 0.57–1.00)
Chloride: 103 mmol/L (ref 96–106)
GFR calc Af Amer: 82 mL/min/{1.73_m2} (ref >59)
GFR, EST NON AFRICAN AMERICAN: 71 mL/min/{1.73_m2} (ref >59)
GLOBULIN, TOTAL: 2.2 g/dL (ref 1.5–4.5)
GLUCOSE: 106 mg/dL — AB (ref 65–99)
Potassium: 4.6 mmol/L (ref 3.5–5.2)
SODIUM: 140 mmol/L (ref 134–144)
Total Protein: 6.7 g/dL (ref 6.0–8.5)

## 2019-01-11 LAB — CBC WITH DIFFERENTIAL/PLATELET
BASOS: 1 %
Basophils Absolute: 0.1 10*3/uL (ref 0.0–0.2)
EOS (ABSOLUTE): 0.2 10*3/uL (ref 0.0–0.4)
EOS: 2 %
HEMATOCRIT: 41.5 % (ref 34.0–46.6)
HEMOGLOBIN: 14.4 g/dL (ref 11.1–15.9)
IMMATURE GRANULOCYTES: 0 %
Immature Grans (Abs): 0 10*3/uL (ref 0.0–0.1)
LYMPHS ABS: 3.8 10*3/uL — AB (ref 0.7–3.1)
Lymphs: 36 %
MCH: 30.3 pg (ref 26.6–33.0)
MCHC: 34.7 g/dL (ref 31.5–35.7)
MCV: 87 fL (ref 79–97)
MONOCYTES: 7 %
Monocytes Absolute: 0.7 10*3/uL (ref 0.1–0.9)
NEUTROS PCT: 54 %
Neutrophils Absolute: 5.9 10*3/uL (ref 1.4–7.0)
Platelets: 334 10*3/uL (ref 150–450)
RBC: 4.75 x10E6/uL (ref 3.77–5.28)
RDW: 13.4 % (ref 11.7–15.4)
WBC: 10.7 10*3/uL (ref 3.4–10.8)

## 2019-01-11 LAB — LIPASE: Lipase: 17 U/L (ref 14–72)

## 2019-01-11 LAB — AMYLASE: AMYLASE: 30 U/L — AB (ref 31–110)

## 2019-01-18 ENCOUNTER — Encounter: Payer: Self-pay | Admitting: Pediatrics

## 2019-01-18 NOTE — Addendum Note (Signed)
Addended by: Baruch Gouty on: 01/18/2019 10:52 AM   Modules accepted: Orders

## 2019-01-26 ENCOUNTER — Ambulatory Visit (HOSPITAL_COMMUNITY)
Admission: RE | Admit: 2019-01-26 | Discharge: 2019-01-26 | Disposition: A | Discharge status: Home / Self Care | Payer: Medicare HMO | Source: Ambulatory Visit | Attending: Family Medicine | Admitting: Family Medicine

## 2019-01-26 DIAGNOSIS — R1011 Right upper quadrant pain: Secondary | ICD-10-CM | POA: Insufficient documentation

## 2019-01-26 DIAGNOSIS — K760 Fatty (change of) liver, not elsewhere classified: Secondary | ICD-10-CM | POA: Diagnosis not present

## 2019-01-26 NOTE — Addendum Note (Signed)
Addended by: Baruch Gouty on: 01/26/2019 03:54 PM   Modules accepted: Orders

## 2019-01-30 ENCOUNTER — Encounter: Payer: Self-pay | Admitting: Internal Medicine

## 2019-01-30 DIAGNOSIS — M79675 Pain in left toe(s): Secondary | ICD-10-CM | POA: Diagnosis not present

## 2019-01-30 DIAGNOSIS — L600 Ingrowing nail: Secondary | ICD-10-CM | POA: Diagnosis not present

## 2019-01-30 DIAGNOSIS — M79674 Pain in right toe(s): Secondary | ICD-10-CM | POA: Diagnosis not present

## 2019-02-07 ENCOUNTER — Ambulatory Visit: Payer: Medicare HMO | Admitting: Family Medicine

## 2019-03-26 ENCOUNTER — Other Ambulatory Visit: Payer: Self-pay

## 2019-03-26 ENCOUNTER — Ambulatory Visit (INDEPENDENT_AMBULATORY_CARE_PROVIDER_SITE_OTHER): Payer: Medicare HMO | Admitting: Family Medicine

## 2019-03-26 ENCOUNTER — Encounter: Payer: Self-pay | Admitting: Family Medicine

## 2019-03-26 DIAGNOSIS — M67922 Unspecified disorder of synovium and tendon, left upper arm: Secondary | ICD-10-CM | POA: Diagnosis not present

## 2019-03-26 MED ORDER — PREDNISONE 20 MG PO TABS: ORAL_TABLET | ORAL | 0 refills | Status: DC

## 2019-03-26 MED ORDER — NAPROXEN 500 MG PO TABS
500.0000 mg | ORAL_TABLET | Freq: Two times a day (BID) | ORAL | 0 refills | Status: DC
Start: 2019-03-26 — End: 2019-04-05
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
## 2019-03-26 NOTE — Patient Instructions (Addendum)
Counter force strap NSAIDS Burst steroid treatment Avoid aggravating movements.

## 2019-04-05 ENCOUNTER — Ambulatory Visit (INDEPENDENT_AMBULATORY_CARE_PROVIDER_SITE_OTHER): Payer: Medicare HMO | Admitting: Gastroenterology

## 2019-04-05 ENCOUNTER — Telehealth: Payer: Self-pay

## 2019-04-05 ENCOUNTER — Other Ambulatory Visit: Payer: Self-pay

## 2019-04-05 ENCOUNTER — Encounter: Payer: Self-pay | Admitting: Gastroenterology

## 2019-04-05 DIAGNOSIS — K760 Fatty (change of) liver, not elsewhere classified: Secondary | ICD-10-CM | POA: Diagnosis not present

## 2019-04-05 DIAGNOSIS — R1011 Right upper quadrant pain: Secondary | ICD-10-CM

## 2019-04-05 DIAGNOSIS — R1310 Dysphagia, unspecified: Secondary | ICD-10-CM | POA: Diagnosis not present

## 2019-04-05 MED ORDER — PANTOPRAZOLE SODIUM 40 MG PO TBEC: 40.0000 mg | DELAYED_RELEASE_TABLET | Freq: Every day | ORAL | 3 refills | Status: DC
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
## 2019-04-05 NOTE — Progress Notes (Signed)
cc'd to pcp 

## 2019-04-05 NOTE — Telephone Encounter (Signed)
HIDA scan scheduled for 04/10/19 at 10:00am, arrive at 9:45am. NPO after midnight and no pain medication. Tried to call pt, no answer, LMOVM for return call.

## 2019-04-05 NOTE — Patient Instructions (Addendum)
We have scheduled you for a HIDA scan to check your gallbladder function.  Please start taking Protonix each morning, 30 minutes before breakfast. This is to help treat reflux.  I have included a handout on fatty liver and diet/behavior recommendations.  Further recommendations to follow!  It was a pleasure to see you today. I strive to create trusting relationships with patients to provide genuine, compassionate, and quality care. I value your feedback. If you receive a survey regarding your visit,  I greatly appreciate you taking time to fill this out.   Annitta Needs, PhD, ANP-BC [facility]    Fatty Liver Disease  Fatty liver disease occurs when too much fat has built up in your liver cells. Fatty liver disease is also called hepatic steatosis or steatohepatitis. The liver removes harmful substances from your bloodstream and produces fluids that your body needs. It also helps your body use and store energy from the food you eat. In many cases, fatty liver disease does not cause symptoms or problems. It is often diagnosed when tests are being done for other reasons. However, over time, fatty liver can cause inflammation that may lead to more serious liver problems, such as scarring of the liver (cirrhosis) and liver failure. Fatty liver is associated with insulin resistance, increased body fat, high blood pressure (hypertension), and high cholesterol. These are features of metabolic syndrome and increase your risk for stroke, diabetes, and heart disease. What are the causes? This condition may be caused by:  Drinking too much alcohol.  Poor nutrition.  Obesity.  Cushing's syndrome.  Diabetes.  High cholesterol.  Certain drugs.  Poisons.  Some viral infections.  Pregnancy. What increases the risk? You are more likely to develop this condition if you:  Abuse alcohol.  Are overweight.  Have diabetes.  Have hepatitis.  Have a high triglyceride  level.  Are pregnant. What are the signs or symptoms? Fatty liver disease often does not cause symptoms. If symptoms do develop, they can include:  Fatigue.  Weakness.  Weight loss.  Confusion.  Abdominal pain.  Nausea and vomiting.  Yellowing of your skin and the white parts of your eyes (jaundice).  Itchy skin. How is this diagnosed? This condition may be diagnosed by:  A physical exam and medical history.  Blood tests.  Imaging tests, such as an ultrasound, CT scan, or MRI.  A liver biopsy. A small sample of liver tissue is removed using a needle. The sample is then looked at under a microscope. How is this treated? Fatty liver disease is often caused by other health conditions. Treatment for fatty liver may involve medicines and lifestyle changes to manage conditions such as:  Alcoholism.  High cholesterol.  Diabetes.  Being overweight or obese. Follow these instructions at home:   Do not drink alcohol. If you have trouble quitting, ask your health care provider how to safely quit with the help of medicine or a supervised program. This is important to keep your condition from getting worse.  Eat a healthy diet as told by your health care provider. Ask your health care provider about working with a diet and nutrition specialist (dietitian) to develop an eating plan.  Exercise regularly. This can help you lose weight and control your cholesterol and diabetes. Talk to your health care provider about an exercise plan and which activities are best for you.  Take over-the-counter and prescription medicines only as told by your health care provider.  Keep all follow-up visits as told by  your health care provider. This is important. Contact a health care provider if: You have trouble controlling your:  Blood sugar. This is especially important if you have diabetes.  Cholesterol.  Drinking of alcohol. Get help right away if:  You have abdominal pain.  You  have jaundice.  You have nausea and vomiting.  You vomit blood or material that looks like coffee grounds.  You have stools that are black, tar-like, or bloody. Summary  Fatty liver disease develops when too much fat builds up in the cells of your liver.  Fatty liver disease often causes no symptoms or problems. However, over time, fatty liver can cause inflammation that may lead to more serious liver problems, such as scarring of the liver (cirrhosis).  You are more likely to develop this condition if you abuse alcohol, are pregnant, are overweight, have diabetes, have hepatitis, or have high triglyceride levels.  Contact your health care provider if you have trouble controlling your weight, blood sugar, cholesterol, or drinking of alcohol. This information is not intended to replace advice given to you by your health care provider. Make sure you discuss any questions you have with your health care provider. Document Released: 12/24/2005 Document Revised: 08/17/2017 Document Reviewed: 08/17/2017 Elsevier Interactive Patient Education  2019 [employer].

## 2019-04-05 NOTE — Telephone Encounter (Signed)
Patient called back and is aware of appt details. She voiced understanding

## 2019-04-09 ENCOUNTER — Other Ambulatory Visit: Payer: Self-pay

## 2019-04-09 ENCOUNTER — Ambulatory Visit (INDEPENDENT_AMBULATORY_CARE_PROVIDER_SITE_OTHER): Payer: Medicare HMO | Admitting: Family Medicine

## 2019-04-09 ENCOUNTER — Encounter: Payer: Self-pay | Admitting: Family Medicine

## 2019-04-09 DIAGNOSIS — M67922 Unspecified disorder of synovium and tendon, left upper arm: Secondary | ICD-10-CM

## 2019-04-09 MED ORDER — NAPROXEN 500 MG PO TABS: 500.0000 mg | ORAL_TABLET | Freq: Two times a day (BID) | ORAL | 1 refills | Status: DC
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
## 2019-04-10 ENCOUNTER — Encounter (HOSPITAL_COMMUNITY): Payer: Self-pay

## 2019-04-10 ENCOUNTER — Encounter (HOSPITAL_COMMUNITY)
Admission: RE | Admit: 2019-04-10 | Discharge: 2019-04-10 | Disposition: A | Discharge status: Home / Self Care | Payer: Medicare HMO | Source: Ambulatory Visit | Attending: Gastroenterology | Admitting: Gastroenterology

## 2019-04-10 ENCOUNTER — Other Ambulatory Visit: Payer: Self-pay

## 2019-04-10 DIAGNOSIS — R1011 Right upper quadrant pain: Secondary | ICD-10-CM | POA: Insufficient documentation

## 2019-04-10 DIAGNOSIS — R112 Nausea with vomiting, unspecified: Secondary | ICD-10-CM | POA: Diagnosis not present

## 2019-04-10 MED ORDER — TECHNETIUM TC 99M MEBROFENIN IV KIT
5.0000 | PACK | Freq: Once | INTRAVENOUS | Status: AC | PRN
  Administered 2019-04-10: 10:00:00 5.3 via INTRAVENOUS

## 2019-04-11 NOTE — Progress Notes (Signed)
HIDA scan is normal. How are you doing with Protonix daily? (sent in [place]).

## 2019-04-12 ENCOUNTER — Ambulatory Visit (INDEPENDENT_AMBULATORY_CARE_PROVIDER_SITE_OTHER): Payer: Medicare HMO

## 2019-04-12 ENCOUNTER — Other Ambulatory Visit: Payer: Self-pay

## 2019-04-12 ENCOUNTER — Encounter: Payer: Self-pay | Admitting: Orthopaedic Surgery

## 2019-04-12 ENCOUNTER — Ambulatory Visit (INDEPENDENT_AMBULATORY_CARE_PROVIDER_SITE_OTHER): Payer: Medicare HMO | Admitting: Orthopaedic Surgery

## 2019-04-12 VITALS — Ht 63.0 in | Wt 190.0 lb

## 2019-04-12 DIAGNOSIS — M7712 Lateral epicondylitis, left elbow: Secondary | ICD-10-CM | POA: Diagnosis not present

## 2019-04-12 DIAGNOSIS — M25522 Pain in left elbow: Secondary | ICD-10-CM | POA: Diagnosis not present
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
## 2019-04-17 ENCOUNTER — Encounter: Payer: Self-pay | Admitting: Orthopaedic Surgery

## 2019-04-17 DIAGNOSIS — M7712 Lateral epicondylitis, left elbow: Secondary | ICD-10-CM | POA: Insufficient documentation

## 2019-04-17 MED ORDER — METHYLPREDNISOLONE ACETATE 40 MG/ML IJ SUSP
40.0000 mg | INTRAMUSCULAR | Status: AC | PRN
  Administered 2019-04-12: 15:00:00 40 mg via INTRA_ARTICULAR

## 2019-04-17 MED ORDER — BUPIVACAINE HCL 0.5 % IJ SOLN
1.0000 mL | INTRAMUSCULAR | Status: AC | PRN
  Administered 2019-04-12: 1 mL via INTRA_ARTICULAR

## 2019-04-17 MED ORDER — LIDOCAINE HCL 1 % IJ SOLN
0.5000 mL | INTRAMUSCULAR | Status: AC | PRN
  Administered 2019-04-12: .5 mL

## 2019-04-18 NOTE — Progress Notes (Signed)
Any further abdominal pain, N/V?

## 2019-04-24 NOTE — Progress Notes (Signed)
Let's arrange an EGD with Dr. Gala Romney as planned due to abdominal pain, N/V. Please ask patient if she is still agreeable to this as per previous plan.

## 2019-04-25 ENCOUNTER — Ambulatory Visit: Payer: Medicare HMO | Admitting: Pediatrics

## 2019-04-26 ENCOUNTER — Ambulatory Visit (INDEPENDENT_AMBULATORY_CARE_PROVIDER_SITE_OTHER): Payer: Medicare HMO | Admitting: Orthopaedic Surgery

## 2019-04-26 ENCOUNTER — Ambulatory Visit (INDEPENDENT_AMBULATORY_CARE_PROVIDER_SITE_OTHER): Payer: Medicare HMO

## 2019-04-26 ENCOUNTER — Encounter: Payer: Self-pay | Admitting: Orthopaedic Surgery

## 2019-04-26 VITALS — Ht 63.0 in | Wt 190.0 lb

## 2019-04-26 DIAGNOSIS — G8929 Other chronic pain: Secondary | ICD-10-CM

## 2019-04-26 DIAGNOSIS — M2241 Chondromalacia patellae, right knee: Secondary | ICD-10-CM

## 2019-04-26 DIAGNOSIS — M25561 Pain in right knee: Secondary | ICD-10-CM

## 2019-04-26 MED ORDER — METHYLPREDNISOLONE ACETATE 40 MG/ML IJ SUSP
40.0000 mg | INTRAMUSCULAR | Status: AC | PRN
  Administered 2019-04-26: 40 mg via INTRA_ARTICULAR

## 2019-04-26 MED ORDER — BUPIVACAINE HCL 0.25 % IJ SOLN
4.0000 mL | INTRAMUSCULAR | Status: AC | PRN
  Administered 2019-04-26: 09:00:00 4 mL via INTRA_ARTICULAR

## 2019-04-26 MED ORDER — LIDOCAINE HCL 1 % IJ SOLN
0.5000 mL | INTRAMUSCULAR | Status: AC | PRN
  Administered 2019-04-26: .5 mL

## 2019-04-26 NOTE — Addendum Note (Signed)
Addended by: Meyer Cory on: 04/26/2019 09:45 AM   Modules accepted: Orders
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
## 2019-04-30 ENCOUNTER — Other Ambulatory Visit: Payer: Self-pay

## 2019-04-30 DIAGNOSIS — R109 Unspecified abdominal pain: Secondary | ICD-10-CM

## 2019-04-30 DIAGNOSIS — R112 Nausea with vomiting, unspecified: Secondary | ICD-10-CM

## 2019-05-02 ENCOUNTER — Other Ambulatory Visit: Payer: Self-pay

## 2019-05-02 ENCOUNTER — Ambulatory Visit: Payer: Medicare HMO | Attending: Orthopaedic Surgery | Admitting: Physical Therapy

## 2019-05-02 ENCOUNTER — Telehealth: Payer: Self-pay

## 2019-05-02 ENCOUNTER — Encounter: Payer: Self-pay | Admitting: Physical Therapy

## 2019-05-02 DIAGNOSIS — M25562 Pain in left knee: Secondary | ICD-10-CM | POA: Diagnosis not present

## 2019-05-02 DIAGNOSIS — M25561 Pain in right knee: Secondary | ICD-10-CM | POA: Diagnosis not present

## 2019-05-02 DIAGNOSIS — R262 Difficulty in walking, not elsewhere classified: Secondary | ICD-10-CM | POA: Diagnosis not present

## 2019-05-02 DIAGNOSIS — G8929 Other chronic pain: Secondary | ICD-10-CM | POA: Insufficient documentation

## 2019-05-02 NOTE — Progress Notes (Signed)
Nurse reviewer called office to obtain further info. Nurse unable to approve case. Case sent for further review.
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
## 2019-05-02 NOTE — Telephone Encounter (Signed)
Opened in error

## 2019-05-04 ENCOUNTER — Encounter: Payer: Self-pay | Admitting: Family Medicine

## 2019-05-07 ENCOUNTER — Other Ambulatory Visit: Payer: Self-pay

## 2019-05-08 ENCOUNTER — Encounter (HOSPITAL_COMMUNITY): Payer: Self-pay | Admitting: Emergency Medicine

## 2019-05-08 ENCOUNTER — Emergency Department (HOSPITAL_COMMUNITY): Payer: Medicare HMO

## 2019-05-08 ENCOUNTER — Ambulatory Visit (INDEPENDENT_AMBULATORY_CARE_PROVIDER_SITE_OTHER): Payer: Medicare HMO | Admitting: Family Medicine

## 2019-05-08 ENCOUNTER — Inpatient Hospital Stay (HOSPITAL_COMMUNITY)
Admission: EM | Admit: 2019-05-08 | Discharge: 2019-05-11 | DRG: 419 | Disposition: A | Discharge status: Home / Self Care | Payer: Medicare HMO | Attending: Internal Medicine | Admitting: Internal Medicine

## 2019-05-08 ENCOUNTER — Other Ambulatory Visit: Payer: Self-pay

## 2019-05-08 ENCOUNTER — Encounter: Payer: Self-pay | Admitting: Family Medicine

## 2019-05-08 VITALS — BP 119/73 | HR 77 | Temp 97.6°F | Ht 63.0 in | Wt 193.2 lb

## 2019-05-08 DIAGNOSIS — K760 Fatty (change of) liver, not elsewhere classified: Secondary | ICD-10-CM | POA: Diagnosis not present

## 2019-05-08 DIAGNOSIS — R1010 Upper abdominal pain, unspecified: Secondary | ICD-10-CM

## 2019-05-08 DIAGNOSIS — R9431 Abnormal electrocardiogram [ECG] [EKG]: Secondary | ICD-10-CM | POA: Diagnosis present

## 2019-05-08 DIAGNOSIS — Z885 Allergy status to narcotic agent status: Secondary | ICD-10-CM | POA: Diagnosis not present

## 2019-05-08 DIAGNOSIS — Z823 Family history of stroke: Secondary | ICD-10-CM | POA: Diagnosis not present

## 2019-05-08 DIAGNOSIS — K811 Chronic cholecystitis: Secondary | ICD-10-CM

## 2019-05-08 DIAGNOSIS — F419 Anxiety disorder, unspecified: Secondary | ICD-10-CM | POA: Diagnosis present

## 2019-05-08 DIAGNOSIS — Z833 Family history of diabetes mellitus: Secondary | ICD-10-CM | POA: Diagnosis not present

## 2019-05-08 DIAGNOSIS — K5730 Diverticulosis of large intestine without perforation or abscess without bleeding: Secondary | ICD-10-CM | POA: Diagnosis not present

## 2019-05-08 DIAGNOSIS — E782 Mixed hyperlipidemia: Secondary | ICD-10-CM | POA: Diagnosis present

## 2019-05-08 DIAGNOSIS — Z882 Allergy status to sulfonamides status: Secondary | ICD-10-CM

## 2019-05-08 DIAGNOSIS — Z1159 Encounter for screening for other viral diseases: Secondary | ICD-10-CM

## 2019-05-08 DIAGNOSIS — G8929 Other chronic pain: Secondary | ICD-10-CM | POA: Diagnosis not present

## 2019-05-08 DIAGNOSIS — R1011 Right upper quadrant pain: Secondary | ICD-10-CM | POA: Diagnosis present

## 2019-05-08 DIAGNOSIS — F17200 Nicotine dependence, unspecified, uncomplicated: Secondary | ICD-10-CM | POA: Diagnosis not present

## 2019-05-08 DIAGNOSIS — F339 Major depressive disorder, recurrent, unspecified: Secondary | ICD-10-CM

## 2019-05-08 DIAGNOSIS — E785 Hyperlipidemia, unspecified: Secondary | ICD-10-CM | POA: Diagnosis present

## 2019-05-08 DIAGNOSIS — Z8261 Family history of arthritis: Secondary | ICD-10-CM | POA: Diagnosis not present

## 2019-05-08 DIAGNOSIS — K828 Other specified diseases of gallbladder: Secondary | ICD-10-CM | POA: Diagnosis not present

## 2019-05-08 DIAGNOSIS — Z720 Tobacco use: Secondary | ICD-10-CM | POA: Diagnosis present

## 2019-05-08 DIAGNOSIS — K219 Gastro-esophageal reflux disease without esophagitis: Secondary | ICD-10-CM | POA: Diagnosis present

## 2019-05-08 DIAGNOSIS — Z03818 Encounter for observation for suspected exposure to other biological agents ruled out: Secondary | ICD-10-CM | POA: Diagnosis not present

## 2019-05-08 DIAGNOSIS — Z8249 Family history of ischemic heart disease and other diseases of the circulatory system: Secondary | ICD-10-CM

## 2019-05-08 DIAGNOSIS — Z79899 Other long term (current) drug therapy: Secondary | ICD-10-CM | POA: Diagnosis not present

## 2019-05-08 DIAGNOSIS — Z888 Allergy status to other drugs, medicaments and biological substances status: Secondary | ICD-10-CM

## 2019-05-08 DIAGNOSIS — F329 Major depressive disorder, single episode, unspecified: Secondary | ICD-10-CM | POA: Diagnosis not present

## 2019-05-08 DIAGNOSIS — K7581 Nonalcoholic steatohepatitis (NASH): Secondary | ICD-10-CM | POA: Diagnosis present

## 2019-05-08 DIAGNOSIS — Z8349 Family history of other endocrine, nutritional and metabolic diseases: Secondary | ICD-10-CM | POA: Diagnosis not present

## 2019-05-08 DIAGNOSIS — F411 Generalized anxiety disorder: Secondary | ICD-10-CM

## 2019-05-08 DIAGNOSIS — Z7951 Long term (current) use of inhaled steroids: Secondary | ICD-10-CM | POA: Diagnosis not present

## 2019-05-08 DIAGNOSIS — Z881 Allergy status to other antibiotic agents status: Secondary | ICD-10-CM | POA: Diagnosis not present

## 2019-05-08 DIAGNOSIS — R109 Unspecified abdominal pain: Secondary | ICD-10-CM | POA: Diagnosis present

## 2019-05-08 DIAGNOSIS — F17210 Nicotine dependence, cigarettes, uncomplicated: Secondary | ICD-10-CM | POA: Diagnosis not present

## 2019-05-08 DIAGNOSIS — K812 Acute cholecystitis with chronic cholecystitis: Secondary | ICD-10-CM | POA: Diagnosis not present

## 2019-05-08 LAB — CBC WITH DIFFERENTIAL/PLATELET
Abs Immature Granulocytes: 0.03 10*3/uL (ref 0.00–0.07)
Basophils Absolute: 0.1 10*3/uL (ref 0.0–0.1)
Basophils Relative: 1 %
Eosinophils Absolute: 0.2 10*3/uL (ref 0.0–0.5)
Eosinophils Relative: 1 %
HCT: 40 % (ref 36.0–46.0)
Hemoglobin: 13.5 g/dL (ref 12.0–15.0)
Immature Granulocytes: 0 %
Lymphocytes Relative: 41 %
Lymphs Abs: 4.8 10*3/uL — ABNORMAL HIGH (ref 0.7–4.0)
MCH: 31 pg (ref 26.0–34.0)
MCHC: 33.8 g/dL (ref 30.0–36.0)
MCV: 91.7 fL (ref 80.0–100.0)
Monocytes Absolute: 0.7 10*3/uL (ref 0.1–1.0)
Monocytes Relative: 6 %
Neutro Abs: 6 10*3/uL (ref 1.7–7.7)
Neutrophils Relative %: 51 %
Platelets: 315 10*3/uL (ref 150–400)
RBC: 4.36 MIL/uL (ref 3.87–5.11)
RDW: 13.5 % (ref 11.5–15.5)
WBC: 11.7 10*3/uL — ABNORMAL HIGH (ref 4.0–10.5)
nRBC: 0 % (ref 0.0–0.2)

## 2019-05-08 LAB — I-STAT BETA HCG BLOOD, ED (MC, WL, AP ONLY): I-stat hCG, quantitative: <5 m[IU]/mL (ref <5)

## 2019-05-08 MED ORDER — HYDROMORPHONE HCL 1 MG/ML IJ SOLN
1.0000 mg | Freq: Once | INTRAMUSCULAR | Status: AC
  Administered 2019-05-08: 23:00:00 1 mg via INTRAVENOUS
  Filled 2019-05-08: qty 1

## 2019-05-08 MED ORDER — ONDANSETRON HCL 4 MG/2ML IJ SOLN
4.0000 mg | Freq: Once | INTRAMUSCULAR | Status: AC
  Administered 2019-05-08: 4 mg via INTRAVENOUS
  Filled 2019-05-08: qty 2

## 2019-05-08 MED ORDER — IOHEXOL 350 MG/ML SOLN: 100.0000 mL | Freq: Once | INTRAVENOUS | Status: DC | PRN

## 2019-05-08 MED ORDER — PANTOPRAZOLE SODIUM 40 MG IV SOLR
40.0000 mg | Freq: Once | INTRAVENOUS | Status: AC
  Administered 2019-05-08: 40 mg via INTRAVENOUS
  Filled 2019-05-08: qty 40

## 2019-05-08 MED ORDER — FLUOXETINE HCL 20 MG PO CAPS: 20.0000 mg | ORAL_CAPSULE | Freq: Every day | ORAL | 3 refills | Status: DC

## 2019-05-08 MED ORDER — BUSPIRONE HCL 5 MG PO TABS: 5.0000 mg | ORAL_TABLET | Freq: Three times a day (TID) | ORAL | 0 refills | Status: DC

## 2019-05-08 NOTE — ED Triage Notes (Signed)
Patient states abdominal pain that radiates from right side to flank/back area. Patient states abdominal pain has been on going x 1 month but pain with radiation became worse today. Patient also states nausea.

## 2019-05-08 NOTE — Patient Instructions (Signed)
If your symptoms worsen or you have thoughts of suicide/homicide, PLEASE SEEK IMMEDIATE MEDICAL ATTENTION.  You may always call the National Suicide Hotline.  This is available 24 hours a day, 7 days a week.  Their number is: 617-486-5675  Taking the medicine as directed and not missing any doses is one of the best things you can do to treat your depression.  Here are some things to keep in mind:  1) Side effects (stomach upset, some increased anxiety) may happen before you notice a benefit.  These side effects typically go away over time. 2) Changes to your dose of medicine or a change in medication all together is sometimes necessary 3) Most people need to be on medication at least 12 months 4) Many people will notice an improvement within two weeks but the full effect of the medication can take up to 4-6 weeks 5) Stopping the medication when you start feeling better often results in a return of symptoms 6) Never discontinue your medication without contacting a health care professional first.  Some medications require gradual discontinuation/ taper and can make you sick if you stop them abruptly.  If your symptoms worsen or you have thoughts of suicide/homicide, PLEASE SEEK IMMEDIATE MEDICAL ATTENTION.  You may always call:  National Suicide Hotline: (206) 418-5313 [place]: (902)517-6709 Crisis Recovery in [place]: 570-465-2151   These are available 24 hours a day, 7 days a week.
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
## 2019-05-08 NOTE — ED Provider Notes (Signed)
11:20 PM Assumed care from Dr. Roderic Palau, please see their note for full history, physical and decision making until this point. In brief this is a 48 y.o. year old female who presented to the ED tonight with Abdominal Pain (right sided flank pain)     History of chronic right upper quadrant abdominal pain for which is been worked up multiple times with ultrasound and x-rays in the past but never a CT scan.  Here is with significant worsening of the pain. Exam concerning for cholecystitis, no [place] present.  Plan is for CT, labs, symptomatic treatment and reevaluation of same.  Reeval, pain meds helped, still in significant pain. Dilaudid ordered, CT pending.   CT w/ pericholecystic edema. Needs [place]. This in concert with her symptoms are concern for acute cholecytitis, will d/w medicine for admission, pain control, [place] in AM and gen surg consult if needed.   Labs, studies and imaging reviewed by myself and considered in medical decision making if ordered. Imaging interpreted by radiology.  Labs Reviewed  CBC WITH DIFFERENTIAL/PLATELET  COMPREHENSIVE METABOLIC PANEL  LIPASE, BLOOD  URINALYSIS, ROUTINE W REFLEX MICROSCOPIC  TROPONIN I  I-STAT BETA HCG BLOOD, ED ([facility], [facility], [facility] ONLY)    CT ABDOMEN PELVIS W CONTRAST    (Results Pending)    No follow-ups on file.    Eddith Mentor, Corene Cornea, MD 05/09/19 [phone]
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
## 2019-05-09 ENCOUNTER — Observation Stay (HOSPITAL_COMMUNITY): Payer: Medicare HMO

## 2019-05-09 ENCOUNTER — Observation Stay (HOSPITAL_BASED_OUTPATIENT_CLINIC_OR_DEPARTMENT_OTHER): Payer: Medicare HMO

## 2019-05-09 ENCOUNTER — Encounter (HOSPITAL_COMMUNITY): Payer: Self-pay | Admitting: Internal Medicine

## 2019-05-09 DIAGNOSIS — R1011 Right upper quadrant pain: Secondary | ICD-10-CM

## 2019-05-09 DIAGNOSIS — R9431 Abnormal electrocardiogram [ECG] [EKG]: Secondary | ICD-10-CM

## 2019-05-09 DIAGNOSIS — K7581 Nonalcoholic steatohepatitis (NASH): Secondary | ICD-10-CM | POA: Diagnosis present

## 2019-05-09 DIAGNOSIS — K828 Other specified diseases of gallbladder: Secondary | ICD-10-CM | POA: Diagnosis not present

## 2019-05-09 DIAGNOSIS — E785 Hyperlipidemia, unspecified: Secondary | ICD-10-CM

## 2019-05-09 DIAGNOSIS — K811 Chronic cholecystitis: Secondary | ICD-10-CM

## 2019-05-09 LAB — COMPREHENSIVE METABOLIC PANEL
ALT: 15 U/L (ref 0–44)
ALT: 16 U/L (ref 0–44)
AST: 11 U/L — ABNORMAL LOW (ref 15–41)
AST: 12 U/L — ABNORMAL LOW (ref 15–41)
Albumin: 3.7 g/dL (ref 3.5–5.0)
Albumin: 3.8 g/dL (ref 3.5–5.0)
Alkaline Phosphatase: 59 U/L (ref 38–126)
Alkaline Phosphatase: 64 U/L (ref 38–126)
Anion gap: 10 (ref 5–15)
Anion gap: 8 (ref 5–15)
BUN: 14 mg/dL (ref 6–20)
BUN: 18 mg/dL (ref 6–20)
CO2: 26 mmol/L (ref 22–32)
CO2: 26 mmol/L (ref 22–32)
Calcium: 8.8 mg/dL — ABNORMAL LOW (ref 8.9–10.3)
Calcium: 9.1 mg/dL (ref 8.9–10.3)
Chloride: 107 mmol/L (ref 98–111)
Chloride: 107 mmol/L (ref 98–111)
Creatinine, Ser: 0.78 mg/dL (ref 0.44–1.00)
Creatinine, Ser: 0.79 mg/dL (ref 0.44–1.00)
GFR calc Af Amer: >60 mL/min (ref >60)
GFR calc Af Amer: >60 mL/min (ref >60)
GFR calc non Af Amer: >60 mL/min (ref >60)
GFR calc non Af Amer: >60 mL/min (ref >60)
Glucose, Bld: 116 mg/dL — ABNORMAL HIGH (ref 70–99)
Glucose, Bld: 118 mg/dL — ABNORMAL HIGH (ref 70–99)
Potassium: 4 mmol/L (ref 3.5–5.1)
Potassium: 4.1 mmol/L (ref 3.5–5.1)
Sodium: 141 mmol/L (ref 135–145)
Sodium: 143 mmol/L (ref 135–145)
Total Bilirubin: 0.3 mg/dL (ref 0.3–1.2)
Total Bilirubin: 0.5 mg/dL (ref 0.3–1.2)
Total Protein: 6.4 g/dL — ABNORMAL LOW (ref 6.5–8.1)
Total Protein: 6.7 g/dL (ref 6.5–8.1)

## 2019-05-09 LAB — CBC WITH DIFFERENTIAL/PLATELET
Abs Immature Granulocytes: 0.02 10*3/uL (ref 0.00–0.07)
Basophils Absolute: 0.1 10*3/uL (ref 0.0–0.1)
Basophils Relative: 0 %
Eosinophils Absolute: 0.2 10*3/uL (ref 0.0–0.5)
Eosinophils Relative: 2 %
HCT: 41.5 % (ref 36.0–46.0)
Hemoglobin: 13.6 g/dL (ref 12.0–15.0)
Immature Granulocytes: 0 %
Lymphocytes Relative: 43 %
Lymphs Abs: 5 10*3/uL — ABNORMAL HIGH (ref 0.7–4.0)
MCH: 30.4 pg (ref 26.0–34.0)
MCHC: 32.8 g/dL (ref 30.0–36.0)
MCV: 92.8 fL (ref 80.0–100.0)
Monocytes Absolute: 0.7 10*3/uL (ref 0.1–1.0)
Monocytes Relative: 6 %
Neutro Abs: 5.7 10*3/uL (ref 1.7–7.7)
Neutrophils Relative %: 49 %
Platelets: 313 10*3/uL (ref 150–400)
RBC: 4.47 MIL/uL (ref 3.87–5.11)
RDW: 13.5 % (ref 11.5–15.5)
WBC: 11.7 10*3/uL — ABNORMAL HIGH (ref 4.0–10.5)
nRBC: 0 % (ref 0.0–0.2)

## 2019-05-09 LAB — URINALYSIS, ROUTINE W REFLEX MICROSCOPIC
Bilirubin Urine: NEGATIVE
Glucose, UA: NEGATIVE mg/dL
Hgb urine dipstick: NEGATIVE
Ketones, ur: NEGATIVE mg/dL
Leukocytes,Ua: NEGATIVE
Nitrite: NEGATIVE
Protein, ur: NEGATIVE mg/dL
Specific Gravity, Urine: 1.031 — ABNORMAL HIGH (ref 1.005–1.030)
pH: 7 (ref 5.0–8.0)

## 2019-05-09 LAB — THYROID PANEL WITH TSH
Free Thyroxine Index: 1.6 (ref 1.2–4.9)
T3 Uptake Ratio: 26 % (ref 24–39)
T4, Total: 6 ug/dL (ref 4.5–12.0)
TSH: 1.39 u[IU]/mL (ref 0.450–4.500)

## 2019-05-09 LAB — ECHOCARDIOGRAM COMPLETE
Height: 63 in
Weight: 3054.4 oz

## 2019-05-09 LAB — TROPONIN I: Troponin I: <0.03 ng/mL (ref <0.03)

## 2019-05-09 LAB — SARS CORONAVIRUS 2 BY RT PCR (HOSPITAL ORDER, PERFORMED IN ~~LOC~~ HOSPITAL LAB): SARS Coronavirus 2: NEGATIVE

## 2019-05-09 LAB — LIPASE, BLOOD: Lipase: 29 U/L (ref 11–51)

## 2019-05-09 MED ORDER — FLUTICASONE PROPIONATE 50 MCG/ACT NA SUSP: 2.0000 | NASAL | Status: DC | PRN

## 2019-05-09 MED ORDER — BUSPIRONE HCL 5 MG PO TABS
5.0000 mg | ORAL_TABLET | Freq: Three times a day (TID) | ORAL | Status: DC
  Administered 2019-05-09 – 2019-05-10 (×6): 5 mg via ORAL
  Filled 2019-05-09 (×6): qty 1

## 2019-05-09 MED ORDER — SODIUM CHLORIDE 0.9 % IV SOLN
1.0000 g | INTRAVENOUS | Status: DC
  Administered 2019-05-09 – 2019-05-10 (×2): 1 g via INTRAVENOUS
  Filled 2019-05-09 (×2): qty 10

## 2019-05-09 MED ORDER — FLUOXETINE HCL 20 MG PO CAPS
20.0000 mg | ORAL_CAPSULE | Freq: Every day | ORAL | Status: DC
  Administered 2019-05-09 – 2019-05-10 (×2): 20 mg via ORAL
  Filled 2019-05-09 (×2): qty 1

## 2019-05-09 MED ORDER — PANTOPRAZOLE SODIUM 40 MG IV SOLR: 40.0000 mg | Freq: Once | INTRAVENOUS | Status: DC

## 2019-05-09 MED ORDER — IOHEXOL 300 MG/ML  SOLN
100.0000 mL | Freq: Once | INTRAMUSCULAR | Status: AC | PRN
  Administered 2019-05-09: 100 mL via INTRAVENOUS

## 2019-05-09 MED ORDER — HYDROMORPHONE HCL 1 MG/ML IJ SOLN
1.0000 mg | Freq: Once | INTRAMUSCULAR | Status: AC
  Administered 2019-05-09: 03:00:00 1 mg via INTRAVENOUS
  Filled 2019-05-09: qty 1

## 2019-05-09 MED ORDER — PANTOPRAZOLE SODIUM 40 MG PO TBEC: 40.0000 mg | DELAYED_RELEASE_TABLET | Freq: Every day | ORAL | Status: DC

## 2019-05-09 MED ORDER — PROCHLORPERAZINE EDISYLATE 10 MG/2ML IJ SOLN
5.0000 mg | INTRAMUSCULAR | Status: DC | PRN
  Administered 2019-05-09 – 2019-05-11 (×2): 5 mg via INTRAVENOUS
  Filled 2019-05-09 (×2): qty 2

## 2019-05-09 MED ORDER — SODIUM CHLORIDE 0.45 % IV SOLN
INTRAVENOUS | Status: DC
  Administered 2019-05-09 – 2019-05-10 (×4): via INTRAVENOUS

## 2019-05-09 MED ORDER — ONDANSETRON HCL 4 MG/2ML IJ SOLN
4.0000 mg | Freq: Four times a day (QID) | INTRAMUSCULAR | Status: DC | PRN
  Administered 2019-05-09 – 2019-05-11 (×5): 4 mg via INTRAVENOUS
  Filled 2019-05-09 (×5): qty 2

## 2019-05-09 MED ORDER — HYDROMORPHONE HCL 1 MG/ML IJ SOLN
1.0000 mg | Freq: Once | INTRAMUSCULAR | Status: AC
  Administered 2019-05-09: 1 mg via INTRAVENOUS
  Filled 2019-05-09: qty 1

## 2019-05-09 MED ORDER — ONDANSETRON HCL 4 MG PO TABS: 4.0000 mg | ORAL_TABLET | Freq: Four times a day (QID) | ORAL | Status: DC | PRN

## 2019-05-09 MED ORDER — PANTOPRAZOLE SODIUM 40 MG IV SOLR
40.0000 mg | INTRAVENOUS | Status: DC
  Administered 2019-05-10: 40 mg via INTRAVENOUS
  Filled 2019-05-09: qty 40

## 2019-05-09 MED ORDER — HYDROMORPHONE HCL 1 MG/ML IJ SOLN
1.0000 mg | INTRAMUSCULAR | Status: DC | PRN
  Administered 2019-05-09 – 2019-05-11 (×9): 1 mg via INTRAVENOUS
  Filled 2019-05-09 (×11): qty 1

## 2019-05-09 MED ORDER — ENOXAPARIN SODIUM 40 MG/0.4ML ~~LOC~~ SOLN
40.0000 mg | SUBCUTANEOUS | Status: DC
  Administered 2019-05-09 – 2019-05-10 (×2): 40 mg via SUBCUTANEOUS
  Filled 2019-05-09 (×2): qty 0.4
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
## 2019-05-09 NOTE — Progress Notes (Signed)
*  PRELIMINARY RESULTS* Echocardiogram 2D Echocardiogram has been performed.  Leavy Cella 05/09/2019, 11:51 AM

## 2019-05-09 NOTE — Progress Notes (Signed)
[facility] PLAN OF CARE NOTE Patient: Emily Weeks IDH:[id number]   PCP: Baruch Gouty, FNP DOB: Jan 31, 1971   DOA: 05/08/2019   DOS: 05/09/2019    Patient was admitted by my colleague Dr. Olevia Bowens earlier on 05/09/2019. I have reviewed the H&P as well as assessment and plan and agree with the same. Important changes in the plan are listed below.  Plan of care: Principal Problem:   RUQ pain Active Problems:   Hyperlipidemia   Tobacco use   Nonalcoholic steatohepatitis (NASH)   Abnormal ECG Cholecystitis. Acute. CT scan and ultrasound as well as symptom do suggest possibility of cholecystitis. No leukocytosis.  No abnormal LFT. Patient overly symptomatic still requiring IV Dilaudid for pain control. Also requires IV Zofran recalls having persistent nausea with dry heaving's. Initially general surgery was consulted.  Recommended GI consultation since the patient was actually scheduled for an EGD for similar complaints outpatient. Appreciate GI and general surgery both assistance. Currently patient will not require any endoscopy. GI advance the patient to clear liquid diet. General surgery is planning for possible lap chole  Author: Berle Mull, MD [facility] 05/09/2019 5:16 PM   If 7PM-7AM, please contact night-coverage at [email or web address]

## 2019-05-09 NOTE — Care Management Obs Status (Signed)
[facility] NOTIFICATION   Patient Details  Name: Emily Weeks MRN: [id number] Date of Birth: 1971/02/06   Medicare Observation Status Notification Given:  Yes    Tommy Medal 05/09/2019, 4:11 PM

## 2019-05-10 ENCOUNTER — Ambulatory Visit: Payer: Medicare HMO | Admitting: Physical Therapy

## 2019-05-10 ENCOUNTER — Telehealth: Payer: Self-pay | Admitting: Gastroenterology

## 2019-05-10 ENCOUNTER — Encounter: Payer: Self-pay | Admitting: Internal Medicine

## 2019-05-10 DIAGNOSIS — Z7951 Long term (current) use of inhaled steroids: Secondary | ICD-10-CM | POA: Diagnosis not present

## 2019-05-10 DIAGNOSIS — Z823 Family history of stroke: Secondary | ICD-10-CM | POA: Diagnosis not present

## 2019-05-10 DIAGNOSIS — Z888 Allergy status to other drugs, medicaments and biological substances status: Secondary | ICD-10-CM | POA: Diagnosis not present

## 2019-05-10 DIAGNOSIS — R1011 Right upper quadrant pain: Secondary | ICD-10-CM | POA: Diagnosis not present

## 2019-05-10 DIAGNOSIS — K219 Gastro-esophageal reflux disease without esophagitis: Secondary | ICD-10-CM | POA: Diagnosis present

## 2019-05-10 DIAGNOSIS — Z881 Allergy status to other antibiotic agents status: Secondary | ICD-10-CM | POA: Diagnosis not present

## 2019-05-10 DIAGNOSIS — E785 Hyperlipidemia, unspecified: Secondary | ICD-10-CM | POA: Diagnosis present

## 2019-05-10 DIAGNOSIS — F17210 Nicotine dependence, cigarettes, uncomplicated: Secondary | ICD-10-CM | POA: Diagnosis present

## 2019-05-10 DIAGNOSIS — K811 Chronic cholecystitis: Secondary | ICD-10-CM | POA: Diagnosis not present

## 2019-05-10 DIAGNOSIS — Z8249 Family history of ischemic heart disease and other diseases of the circulatory system: Secondary | ICD-10-CM | POA: Diagnosis not present

## 2019-05-10 DIAGNOSIS — K7581 Nonalcoholic steatohepatitis (NASH): Secondary | ICD-10-CM | POA: Diagnosis present

## 2019-05-10 DIAGNOSIS — Z882 Allergy status to sulfonamides status: Secondary | ICD-10-CM | POA: Diagnosis not present

## 2019-05-10 DIAGNOSIS — G8929 Other chronic pain: Secondary | ICD-10-CM | POA: Diagnosis present

## 2019-05-10 DIAGNOSIS — Z79899 Other long term (current) drug therapy: Secondary | ICD-10-CM | POA: Diagnosis not present

## 2019-05-10 DIAGNOSIS — K812 Acute cholecystitis with chronic cholecystitis: Secondary | ICD-10-CM | POA: Diagnosis present

## 2019-05-10 DIAGNOSIS — Z8261 Family history of arthritis: Secondary | ICD-10-CM | POA: Diagnosis not present

## 2019-05-10 DIAGNOSIS — Z8349 Family history of other endocrine, nutritional and metabolic diseases: Secondary | ICD-10-CM | POA: Diagnosis not present

## 2019-05-10 DIAGNOSIS — Z885 Allergy status to narcotic agent status: Secondary | ICD-10-CM | POA: Diagnosis not present

## 2019-05-10 DIAGNOSIS — R109 Unspecified abdominal pain: Secondary | ICD-10-CM | POA: Diagnosis present

## 2019-05-10 DIAGNOSIS — Z833 Family history of diabetes mellitus: Secondary | ICD-10-CM | POA: Diagnosis not present

## 2019-05-10 DIAGNOSIS — Z1159 Encounter for screening for other viral diseases: Secondary | ICD-10-CM | POA: Diagnosis not present

## 2019-05-10 DIAGNOSIS — F419 Anxiety disorder, unspecified: Secondary | ICD-10-CM | POA: Diagnosis present

## 2019-05-10 LAB — CBC WITH DIFFERENTIAL/PLATELET
Abs Immature Granulocytes: 0.03 10*3/uL (ref 0.00–0.07)
Basophils Absolute: 0.1 10*3/uL (ref 0.0–0.1)
Basophils Relative: 1 %
Eosinophils Absolute: 0.1 10*3/uL (ref 0.0–0.5)
Eosinophils Relative: 1 %
HCT: 41.7 % (ref 36.0–46.0)
Hemoglobin: 13.8 g/dL (ref 12.0–15.0)
Immature Granulocytes: 0 %
Lymphocytes Relative: 36 %
Lymphs Abs: 4.3 10*3/uL — ABNORMAL HIGH (ref 0.7–4.0)
MCH: 30.5 pg (ref 26.0–34.0)
MCHC: 33.1 g/dL (ref 30.0–36.0)
MCV: 92.3 fL (ref 80.0–100.0)
Monocytes Absolute: 0.7 10*3/uL (ref 0.1–1.0)
Monocytes Relative: 6 %
Neutro Abs: 6.7 10*3/uL (ref 1.7–7.7)
Neutrophils Relative %: 56 %
Platelets: 313 10*3/uL (ref 150–400)
RBC: 4.52 MIL/uL (ref 3.87–5.11)
RDW: 13.6 % (ref 11.5–15.5)
WBC: 11.9 10*3/uL — ABNORMAL HIGH (ref 4.0–10.5)
nRBC: 0 % (ref 0.0–0.2)

## 2019-05-10 LAB — COMPREHENSIVE METABOLIC PANEL
ALT: 16 U/L (ref 0–44)
AST: 11 U/L — ABNORMAL LOW (ref 15–41)
Albumin: 3.5 g/dL (ref 3.5–5.0)
Alkaline Phosphatase: 57 U/L (ref 38–126)
Anion gap: 7 (ref 5–15)
BUN: 8 mg/dL (ref 6–20)
CO2: 28 mmol/L (ref 22–32)
Calcium: 9 mg/dL (ref 8.9–10.3)
Chloride: 107 mmol/L (ref 98–111)
Creatinine, Ser: 0.78 mg/dL (ref 0.44–1.00)
GFR calc Af Amer: >60 mL/min (ref >60)
GFR calc non Af Amer: >60 mL/min (ref >60)
Glucose, Bld: 97 mg/dL (ref 70–99)
Potassium: 4.2 mmol/L (ref 3.5–5.1)
Sodium: 142 mmol/L (ref 135–145)
Total Bilirubin: 0.6 mg/dL (ref 0.3–1.2)
Total Protein: 6.3 g/dL — ABNORMAL LOW (ref 6.5–8.1)

## 2019-05-10 LAB — SURGICAL PCR SCREEN
MRSA, PCR: NEGATIVE
Staphylococcus aureus: NEGATIVE

## 2019-05-10 LAB — MAGNESIUM: Magnesium: 2 mg/dL (ref 1.7–2.4)

## 2019-05-10 MED ORDER — CHLORHEXIDINE GLUCONATE CLOTH 2 % EX PADS
6.0000 | MEDICATED_PAD | Freq: Once | CUTANEOUS | Status: AC
  Administered 2019-05-10: 6 via TOPICAL

## 2019-05-10 MED ORDER — SODIUM CHLORIDE 0.45 % IV SOLN
INTRAVENOUS | Status: DC
  Administered 2019-05-10: 21:00:00 via INTRAVENOUS

## 2019-05-10 MED ORDER — CHLORHEXIDINE GLUCONATE CLOTH 2 % EX PADS
6.0000 | MEDICATED_PAD | Freq: Once | CUTANEOUS | Status: AC
  Administered 2019-05-11: 04:00:00 6 via TOPICAL

## 2019-05-10 MED ORDER — CHLORHEXIDINE GLUCONATE CLOTH 2 % EX PADS
6.0000 | MEDICATED_PAD | Freq: Once | CUTANEOUS | Status: AC
  Administered 2019-05-11: 6 via TOPICAL

## 2019-05-10 NOTE — Telephone Encounter (Signed)
LMOVM for endo scheduler to cancel procedure

## 2019-05-10 NOTE — H&P (View-Only) (Signed)
Subjective: Still with intermittent right upper quadrant abdominal pain.  Nausea seems to have resolved.  Objective: Vital signs in last 24 hours: Temp:  [97.7 F (36.5 C)-98.3 F (36.8 C)] 98.3 F (36.8 C) (06/18 0520) Pulse Rate:  [50-79] 64 (06/18 0520) Resp:  [16] 16 (06/18 0520) BP: (127-147)/(77-90) 129/90 (06/18 0520) SpO2:  [97 %-98 %] 98 % (06/18 0520) Last BM Date: 05/08/19  Intake/Output from previous day: 06/17 0701 - 06/18 0700 In: 2671.8 [I.V.:2571.8; IV Piggyback:100] Out: -  Intake/Output this shift: No intake/output data recorded.  General appearance: alert, cooperative and no distress GI: Soft with mild tenderness noted the right upper quadrant to palpation.  No rigidity is noted.  Lab Results:  Recent Labs    05/09/19 0836 05/10/19 0512  WBC 11.7* 11.9*  HGB 13.6 13.8  HCT 41.5 41.7  PLT 313 313   BMET Recent Labs    05/09/19 0836 05/10/19 0512  NA 141 142  K 4.1 4.2  CL 107 107  CO2 26 28  GLUCOSE 118* 97  BUN 14 8  CREATININE 0.78 0.78  CALCIUM 8.8* 9.0   PT/INR No results for input(s): LABPROT, INR in the last 72 hours.  Studies/Results: Ct Abdomen Pelvis W Contrast  Result Date: 05/09/2019 CLINICAL DATA:  Abdominal distension. Abdominal pain pain for 1 month, worse today. EXAM: CT ABDOMEN AND PELVIS WITH CONTRAST TECHNIQUE: Multidetector CT imaging of the abdomen and pelvis was performed using the standard protocol following bolus administration of intravenous contrast. CONTRAST:  149m OMNIPAQUE IOHEXOL 300 MG/ML  SOLN COMPARISON:  Right upper quadrant ultrasound 01/26/2019 FINDINGS: Lower chest: Breathing motion artifact. Possible emphysema. Focal airspace disease or pleural effusion. Hepatobiliary: Prominent liver spanning 18.9 cm with hepatic steatosis. No focal hepatic abnormality. Gallbladder is physiologically distended, however there is pericholecystic edema. No calcified gallstones. No biliary dilatation. Pancreas: No ductal  dilatation or inflammation. Spleen: Normal in size without focal abnormality. Adrenals/Urinary Tract: Normal adrenal glands. No hydronephrosis or perinephric edema. Homogeneous renal enhancement with symmetric excretion on delayed phase imaging. Urinary bladder is physiologically distended without wall thickening. Stomach/Bowel: Stomach is within normal limits. Appendix is surgically absent. No evidence of bowel wall thickening, distention, or inflammatory changes. Suspect diffuse scattered distal colonic diverticula without diverticulitis. Vascular/Lymphatic: Mild aortic atherosclerosis. No aneurysm. Portal vein and mesenteric vessels are patent. No abdominopelvic adenopathy. Reproductive: Uterus and bilateral adnexa are unremarkable. Other: Scattered subcutaneous metallic densities in the anterior abdominal wall. Small fat containing supraumbilical ventral abdominal wall hernia. No ascites or free air. Musculoskeletal: Degenerative disc disease at L5-S1. There are no acute or suspicious osseous abnormalities. IMPRESSION: 1. Pericholecystic edema about the gallbladder, suspicious for acute cholecystitis. No calcified gallstones. Recommend right upper quadrant ultrasound for characterization. 2. Hepatomegaly and hepatic steatosis. 3. Mild distal colonic diverticulosis without diverticulitis. 4.  Aortic Atherosclerosis (ICD10-I70.0). Electronically Signed   By: MKeith RakeM.D.   On: 05/09/2019 00:42   [place]Abdomen Limited Ruq  Result Date: 05/09/2019 CLINICAL DATA:  CT 05/08/2019.  Ultrasound 01/26/2019. EXAM: ULTRASOUND ABDOMEN LIMITED RIGHT UPPER QUADRANT COMPARISON:  CT 05/08/2019.  Ultrasound 01/26/2019. FINDINGS: Gallbladder: No gallstones or biliary distention. Gallbladder wall thickening at 5 mm. Tiny amount of pericholecystic fluid cannot be excluded. Positive ultrasound Murphy sign. Common bile duct: Diameter: 4 mm Liver: Increased hepatic echogenicity consistent fatty infiltration or hepatocellular  disease. No focal hepatic abnormality identified. Portal vein is patent on color Doppler imaging with normal direction of blood flow towards the liver. IMPRESSION: 1. No gallstones or biliary  distention. Thickening of the gallbladder wall at 5 mm. Tiny amount pericholecystic fluid collection. Positive ultrasound Murphy sign. Acalculous cholecystitis cannot be excluded. 2. Increased echogenicity consistent fatty infiltration or hepatocellular disease. Electronically Signed   By: Marcello Moores  Register   On: 05/09/2019 09:28    Anti-infectives: Anti-infectives (From admission, onward)   Start     Dose/Rate Route Frequency Ordered Stop   05/09/19 1715  cefTRIAXone (ROCEPHIN) 1 g in sodium chloride 0.9 % 100 mL IVPB     1 g 200 mL/hr over 30 Minutes Intravenous Every 24 hours 05/09/19 1701        Assessment/Plan: Impression: Right upper quadrant abdominal pain most likely secondary to chronic cholecystitis.  Appreciate GI input. Plan: We will proceed with laparoscopic cholecystectomy tomorrow.  The risks and benefits of the procedure including bleeding, infection, hepatobiliary injury, and the possibility of an open procedure were fully explained to the patient, who gave informed consent.  LOS: 0 days    Aviva Signs 05/10/2019

## 2019-05-10 NOTE — Progress Notes (Signed)
[facility] Progress Note  Patient: Emily Weeks VOJ:[id number]   PCP: Baruch Gouty, FNP DOB: 03-12-1971   DOA: 05/08/2019   DOS: 05/10/2019   Date of Service: the patient was seen and examined on 05/10/2019  Brief hospital course: Pt. with PMH of HLD, active smoker, GERD, Karlene Lineman; admitted on 05/08/2019, presented with complaint of right upper quadrant pain, was found to have?  Cholecystitis. Currently further plan is lap chole tomorrow.  Subjective: Pain still present but improving.  No nausea no vomiting.  No diarrhea.  Passing gas.  No blood in the urine.  Assessment and Plan: 1.  Acute cholecystitis. Continue with IV ceftriaxone. Appreciate input from GI and general surgery. Patient scheduled for lap chole tomorrow. Continue gentle IV hydration. Definitely liquid diet.  N.p.o. after midnight.    Hyperlipidemia Exercise and high-fiber diet are key. Given the degree of hepatic steatosis, statins could be deleterious.    Tobacco use Occasional use only. Encouraged the patient to quit, given CVS/cancer/etc. Risks.  Diet: Clear liquid diet DVT Prophylaxis: Subcutaneous Heparin   Advance goals of care discussion: Full code  Family Communication: no family was present at bedside, at the time of interview.   Disposition:  Discharge to Home .  Consultants: gastroenterology General surgery  Procedures: none  Scheduled Meds: . busPIRone  5 mg Oral TID  . enoxaparin (LOVENOX) injection  40 mg Subcutaneous Q24H  . FLUoxetine  20 mg Oral Daily  . pantoprazole (PROTONIX) IV  40 mg Intravenous Q24H   Continuous Infusions: . sodium chloride 100 mL/hr at 05/10/19 1139  . cefTRIAXone (ROCEPHIN)  IV 1 g (05/10/19 1707)   PRN Meds: fluticasone, HYDROmorphone (DILAUDID) injection, iohexol, ondansetron **OR** ondansetron (ZOFRAN) IV, prochlorperazine Antibiotics: Anti-infectives (From admission, onward)   Start     Dose/Rate Route Frequency Ordered Stop   05/09/19 1715   cefTRIAXone (ROCEPHIN) 1 g in sodium chloride 0.9 % 100 mL IVPB     1 g 200 mL/hr over 30 Minutes Intravenous Every 24 hours 05/09/19 1701         Objective: Physical Exam: Vitals:   05/09/19 1312 05/09/19 2101 05/09/19 2127 05/10/19 0520  BP: 127/77  (!) 147/86 129/90  Pulse: (!) 50  79 64  Resp: 16  16 16   Temp: 98 F (36.7 C)  97.7 F (36.5 C) 98.3 F (36.8 C)  TempSrc: Oral  Oral Oral  SpO2: 97% 98% 98% 98%  Weight:      Height:        Intake/Output Summary (Last 24 hours) at 05/10/2019 1750 Last data filed at 05/10/2019 0634 Gross per 24 hour  Intake 2671.76 ml  Output -  Net 2671.76 ml   Filed Weights   05/08/19 2238 05/09/19 0325  Weight: 87.5 kg 86.6 kg   General: alert and oriented to time, place, and person. Appear in mild distress, affect appropriate Eyes: PERRL, Conjunctiva normal ENT: Oral Mucosa Clear, moist  Neck: no JVD, no Abnormal Mass Or lumps Cardiovascular: S1 and S2 Present, no Murmur, peripheral pulses symmetrical Respiratory: normal respiratory effort, Bilateral Air entry equal and Decreased, no use of accessory muscle, Clear to Auscultation, no Crackles, no wheezes Abdomen: Bowel Sound present, Soft and no tenderness, no hernia Skin: no rashes  Extremities: no Pedal edema, no calf tenderness Neurologic: normal without focal findings, mental status, speech normal, alert and oriented x3, PERLA, Motor strength 5/5 and symmetric and sensation grossly normal to light touch Gait not checked due to patient safety concerns  Data  Reviewed: CBC: Recent Labs  Lab 05/08/19 2332 05/09/19 0836 05/10/19 0512  WBC 11.7* 11.7* 11.9*  NEUTROABS 6.0 5.7 6.7  HGB 13.5 13.6 13.8  HCT 40.0 41.5 41.7  MCV 91.7 92.8 92.3  PLT 315 313 741   Basic Metabolic Panel: Recent Labs  Lab 05/08/19 2332 05/09/19 0836 05/10/19 0512  NA 143 141 142  K 4.0 4.1 4.2  CL 107 107 107  CO2 26 26 28   GLUCOSE 116* 118* 97  BUN 18 14 8   CREATININE 0.79 0.78 0.78   CALCIUM 9.1 8.8* 9.0  MG  --   --  2.0    Liver Function Tests: Recent Labs  Lab 05/08/19 2332 05/09/19 0836 05/10/19 0512  AST 12* 11* 11*  ALT 16 15 16   ALKPHOS 64 59 57  BILITOT 0.3 0.5 0.6  PROT 6.7 6.4* 6.3*  ALBUMIN 3.8 3.7 3.5   Recent Labs  Lab 05/08/19 2332  LIPASE 29   No results for input(s): AMMONIA in the last 168 hours. Coagulation Profile: No results for input(s): INR, PROTIME in the last 168 hours. Cardiac Enzymes: Recent Labs  Lab 05/08/19 2332  TROPONINI <0.03   BNP (last 3 results) No results for input(s): PROBNP in the last 8760 hours. CBG: No results for input(s): GLUCAP in the last 168 hours. Studies: No results found.   Time spent: 35 minutes  Author: Berle Mull, MD [facility] 05/10/2019 5:50 PM  To reach On-call, see care teams to locate the attending and reach out to them via [email or web address]. If 7PM-7AM, please contact night-coverage If you still have difficulty reaching the attending provider, please page the [facility] (Director on Call) for [facility] on amion for assistance.

## 2019-05-10 NOTE — Progress Notes (Signed)
Subjective: Still with intermittent right upper quadrant abdominal pain.  Nausea seems to have resolved.  Objective: Vital signs in last 24 hours: Temp:  [97.7 F (36.5 C)-98.3 F (36.8 C)] 98.3 F (36.8 C) (06/18 0520) Pulse Rate:  [50-79] 64 (06/18 0520) Resp:  [16] 16 (06/18 0520) BP: (127-147)/(77-90) 129/90 (06/18 0520) SpO2:  [97 %-98 %] 98 % (06/18 0520) Last BM Date: 05/08/19  Intake/Output from previous day: 06/17 0701 - 06/18 0700 In: 2671.8 [I.V.:2571.8; IV Piggyback:100] Out: -  Intake/Output this shift: No intake/output data recorded.  General appearance: alert, cooperative and no distress GI: Soft with mild tenderness noted the right upper quadrant to palpation.  No rigidity is noted.  Lab Results:  Recent Labs    05/09/19 0836 05/10/19 0512  WBC 11.7* 11.9*  HGB 13.6 13.8  HCT 41.5 41.7  PLT 313 313   BMET Recent Labs    05/09/19 0836 05/10/19 0512  NA 141 142  K 4.1 4.2  CL 107 107  CO2 26 28  GLUCOSE 118* 97  BUN 14 8  CREATININE 0.78 0.78  CALCIUM 8.8* 9.0   PT/INR No results for input(s): LABPROT, INR in the last 72 hours.  Studies/Results: Ct Abdomen Pelvis W Contrast  Result Date: 05/09/2019 CLINICAL DATA:  Abdominal distension. Abdominal pain pain for 1 month, worse today. EXAM: CT ABDOMEN AND PELVIS WITH CONTRAST TECHNIQUE: Multidetector CT imaging of the abdomen and pelvis was performed using the standard protocol following bolus administration of intravenous contrast. CONTRAST:  183m OMNIPAQUE IOHEXOL 300 MG/ML  SOLN COMPARISON:  Right upper quadrant ultrasound 01/26/2019 FINDINGS: Lower chest: Breathing motion artifact. Possible emphysema. Focal airspace disease or pleural effusion. Hepatobiliary: Prominent liver spanning 18.9 cm with hepatic steatosis. No focal hepatic abnormality. Gallbladder is physiologically distended, however there is pericholecystic edema. No calcified gallstones. No biliary dilatation. Pancreas: No ductal  dilatation or inflammation. Spleen: Normal in size without focal abnormality. Adrenals/Urinary Tract: Normal adrenal glands. No hydronephrosis or perinephric edema. Homogeneous renal enhancement with symmetric excretion on delayed phase imaging. Urinary bladder is physiologically distended without wall thickening. Stomach/Bowel: Stomach is within normal limits. Appendix is surgically absent. No evidence of bowel wall thickening, distention, or inflammatory changes. Suspect diffuse scattered distal colonic diverticula without diverticulitis. Vascular/Lymphatic: Mild aortic atherosclerosis. No aneurysm. Portal vein and mesenteric vessels are patent. No abdominopelvic adenopathy. Reproductive: Uterus and bilateral adnexa are unremarkable. Other: Scattered subcutaneous metallic densities in the anterior abdominal wall. Small fat containing supraumbilical ventral abdominal wall hernia. No ascites or free air. Musculoskeletal: Degenerative disc disease at L5-S1. There are no acute or suspicious osseous abnormalities. IMPRESSION: 1. Pericholecystic edema about the gallbladder, suspicious for acute cholecystitis. No calcified gallstones. Recommend right upper quadrant ultrasound for characterization. 2. Hepatomegaly and hepatic steatosis. 3. Mild distal colonic diverticulosis without diverticulitis. 4.  Aortic Atherosclerosis (ICD10-I70.0). Electronically Signed   By: MKeith RakeM.D.   On: 05/09/2019 00:42   UKoreaAbdomen Limited Ruq  Result Date: 05/09/2019 CLINICAL DATA:  CT 05/08/2019.  Ultrasound 01/26/2019. EXAM: ULTRASOUND ABDOMEN LIMITED RIGHT UPPER QUADRANT COMPARISON:  CT 05/08/2019.  Ultrasound 01/26/2019. FINDINGS: Gallbladder: No gallstones or biliary distention. Gallbladder wall thickening at 5 mm. Tiny amount of pericholecystic fluid cannot be excluded. Positive ultrasound Murphy sign. Common bile duct: Diameter: 4 mm Liver: Increased hepatic echogenicity consistent fatty infiltration or hepatocellular  disease. No focal hepatic abnormality identified. Portal vein is patent on color Doppler imaging with normal direction of blood flow towards the liver. IMPRESSION: 1. No gallstones or biliary  distention. Thickening of the gallbladder wall at 5 mm. Tiny amount pericholecystic fluid collection. Positive ultrasound Murphy sign. Acalculous cholecystitis cannot be excluded. 2. Increased echogenicity consistent fatty infiltration or hepatocellular disease. Electronically Signed   By: Marcello Moores  Register   On: 05/09/2019 09:28    Anti-infectives: Anti-infectives (From admission, onward)   Start     Dose/Rate Route Frequency Ordered Stop   05/09/19 1715  cefTRIAXone (ROCEPHIN) 1 g in sodium chloride 0.9 % 100 mL IVPB     1 g 200 mL/hr over 30 Minutes Intravenous Every 24 hours 05/09/19 1701        Assessment/Plan: Impression: Right upper quadrant abdominal pain most likely secondary to chronic cholecystitis.  Appreciate GI input. Plan: We will proceed with laparoscopic cholecystectomy tomorrow.  The risks and benefits of the procedure including bleeding, infection, hepatobiliary injury, and the possibility of an open procedure were fully explained to the patient, who gave informed consent.  LOS: 0 days    Aviva Signs 05/10/2019

## 2019-05-10 NOTE — Telephone Encounter (Signed)
Noted  

## 2019-05-10 NOTE — Progress Notes (Signed)
Patient briefly seen today. Seen in consultation yesterday by Dr. Laural Golden, covering for [facility]. Patient presented with recurrent ruq pain. CT and U/S c/w acute cholecystitis. Plans for cholecystectomy tomorrow. Will cancel outpatient EGD planned for 06/2019.   Patient will need routine follow up in six months for fatty liver/hepatomegaly.   Laureen Ochs. Bernarda Caffey [facility] [phone] 6/18/20209:40 AM

## 2019-05-10 NOTE — Telephone Encounter (Signed)
Patient in hospital with acute cholecystitis. Plans for cholecystectomy tomorrow.   No longer needs EGD in 06/2019. Please cancel.   Make OV in six months for f/u fatty liver/hepatomegaly.  FYI AB.

## 2019-05-10 NOTE — Telephone Encounter (Signed)
SCHEDULED AND LETTER SENT  °

## 2019-05-11 ENCOUNTER — Inpatient Hospital Stay (HOSPITAL_COMMUNITY): Payer: Medicare HMO | Admitting: Anesthesiology

## 2019-05-11 ENCOUNTER — Encounter (HOSPITAL_COMMUNITY): Payer: Self-pay | Admitting: *Deleted

## 2019-05-11 ENCOUNTER — Other Ambulatory Visit: Payer: Self-pay

## 2019-05-11 ENCOUNTER — Encounter (HOSPITAL_COMMUNITY)
Admission: EM | Disposition: A | Discharge status: Home / Self Care | Payer: Self-pay | Source: Home / Self Care | Attending: Internal Medicine

## 2019-05-11 HISTORY — PX: CHOLECYSTECTOMY: SHX55

## 2019-05-11 LAB — GLUCOSE, CAPILLARY: Glucose-Capillary: 114 mg/dL — ABNORMAL HIGH (ref 70–99)

## 2019-05-11 SURGERY — LAPAROSCOPIC CHOLECYSTECTOMY
Anesthesia: General | Site: Abdomen

## 2019-05-11 MED ORDER — GLYCOPYRROLATE PF 0.2 MG/ML IJ SOSY
PREFILLED_SYRINGE | INTRAMUSCULAR | Status: AC
  Filled 2019-05-11: qty 4

## 2019-05-11 MED ORDER — KETOROLAC TROMETHAMINE 30 MG/ML IJ SOLN
30.0000 mg | Freq: Once | INTRAMUSCULAR | Status: AC
  Administered 2019-05-11: 12:00:00 30 mg via INTRAVENOUS
  Filled 2019-05-11: qty 1

## 2019-05-11 MED ORDER — PROPOFOL 10 MG/ML IV BOLUS
INTRAVENOUS | Status: DC | PRN
  Administered 2019-05-11: 170 mg via INTRAVENOUS

## 2019-05-11 MED ORDER — DEXAMETHASONE SODIUM PHOSPHATE 4 MG/ML IJ SOLN
INTRAMUSCULAR | Status: DC | PRN
  Administered 2019-05-11: 8 mg via INTRAVENOUS

## 2019-05-11 MED ORDER — MIDAZOLAM HCL 2 MG/2ML IJ SOLN: 0.5000 mg | Freq: Once | INTRAMUSCULAR | Status: DC | PRN

## 2019-05-11 MED ORDER — FENTANYL CITRATE (PF) 250 MCG/5ML IJ SOLN
INTRAMUSCULAR | Status: AC
  Filled 2019-05-11: qty 5

## 2019-05-11 MED ORDER — GLYCOPYRROLATE PF 0.2 MG/ML IJ SOSY
PREFILLED_SYRINGE | INTRAMUSCULAR | Status: DC | PRN
  Administered 2019-05-11: .2 mg via INTRAVENOUS

## 2019-05-11 MED ORDER — ROCURONIUM BROMIDE 10 MG/ML (PF) SYRINGE
PREFILLED_SYRINGE | INTRAVENOUS | Status: AC
  Filled 2019-05-11: qty 10

## 2019-05-11 MED ORDER — ROCURONIUM BROMIDE 50 MG/5ML IV SOSY
PREFILLED_SYRINGE | INTRAVENOUS | Status: DC | PRN
  Administered 2019-05-11: 50 mg via INTRAVENOUS

## 2019-05-11 MED ORDER — FENTANYL CITRATE (PF) 100 MCG/2ML IJ SOLN
INTRAMUSCULAR | Status: DC | PRN
  Administered 2019-05-11 (×3): 50 ug via INTRAVENOUS
  Administered 2019-05-11: 100 ug via INTRAVENOUS

## 2019-05-11 MED ORDER — 0.9 % SODIUM CHLORIDE (POUR BTL) OPTIME
TOPICAL | Status: DC | PRN
  Administered 2019-05-11: 11:00:00 1000 mL

## 2019-05-11 MED ORDER — BUPIVACAINE LIPOSOME 1.3 % IJ SUSP
INTRAMUSCULAR | Status: DC | PRN
  Administered 2019-05-11: 20 mL

## 2019-05-11 MED ORDER — TRAMADOL HCL 50 MG PO TABS: 50.0000 mg | ORAL_TABLET | Freq: Four times a day (QID) | ORAL | Status: DC | PRN

## 2019-05-11 MED ORDER — LIDOCAINE 2% (20 MG/ML) 5 ML SYRINGE
INTRAMUSCULAR | Status: AC
  Filled 2019-05-11: qty 10

## 2019-05-11 MED ORDER — HYDROMORPHONE HCL 1 MG/ML IJ SOLN
0.2500 mg | INTRAMUSCULAR | Status: DC | PRN
  Administered 2019-05-11 (×2): 0.5 mg via INTRAVENOUS

## 2019-05-11 MED ORDER — HEMOSTATIC AGENTS (NO CHARGE) OPTIME
TOPICAL | Status: DC | PRN
  Administered 2019-05-11: 1 via TOPICAL

## 2019-05-11 MED ORDER — PROPOFOL 10 MG/ML IV BOLUS
INTRAVENOUS | Status: AC
  Filled 2019-05-11: qty 40

## 2019-05-11 MED ORDER — LIDOCAINE 2% (20 MG/ML) 5 ML SYRINGE
INTRAMUSCULAR | Status: DC | PRN
  Administered 2019-05-11: 40 mg via INTRAVENOUS

## 2019-05-11 MED ORDER — SODIUM CHLORIDE 0.9 % IV SOLN
INTRAVENOUS | Status: DC
  Administered 2019-05-11: 13:00:00 via INTRAVENOUS

## 2019-05-11 MED ORDER — ONDANSETRON HCL 4 MG/2ML IJ SOLN
INTRAMUSCULAR | Status: DC | PRN
  Administered 2019-05-11: 4 mg via INTRAVENOUS

## 2019-05-11 MED ORDER — BUPIVACAINE LIPOSOME 1.3 % IJ SUSP
INTRAMUSCULAR | Status: AC
  Filled 2019-05-11: qty 20

## 2019-05-11 MED ORDER — TRAMADOL HCL 50 MG PO TABS: 50.0000 mg | ORAL_TABLET | Freq: Four times a day (QID) | ORAL | 0 refills | Status: DC | PRN

## 2019-05-11 MED ORDER — LACTATED RINGERS IV SOLN
INTRAVENOUS | Status: DC
  Administered 2019-05-11: 10:00:00 via INTRAVENOUS

## 2019-05-11 MED ORDER — CEPHALEXIN 500 MG PO CAPS: 500.0000 mg | ORAL_CAPSULE | Freq: Two times a day (BID) | ORAL | 0 refills | Status: AC

## 2019-05-11 SURGICAL SUPPLY — 46 items
APPLIER CLIP ROT 10 11.4 M/L (STAPLE) ×3
BAG RETRIEVAL 10 (BASKET) ×1
BAG RETRIEVAL 10MM (BASKET) ×1
CHLORAPREP W/TINT 26 (MISCELLANEOUS) ×3 IMPLANT
CLIP APPLIE ROT 10 11.4 M/L (STAPLE) ×1 IMPLANT
CLOTH BEACON ORANGE TIMEOUT ST (SAFETY) ×3 IMPLANT
COVER LIGHT HANDLE STERIS (MISCELLANEOUS) ×6 IMPLANT
COVER WAND RF STERILE (DRAPES) ×3 IMPLANT
DERMABOND ADVANCED (GAUZE/BANDAGES/DRESSINGS) ×2
DERMABOND ADVANCED .7 DNX12 (GAUZE/BANDAGES/DRESSINGS) ×1 IMPLANT
ELECT REM PT RETURN 9FT ADLT (ELECTROSURGICAL) ×3
ELECTRODE REM PT RTRN 9FT ADLT (ELECTROSURGICAL) ×1 IMPLANT
FILTER SMOKE EVAC LAPAROSHD (FILTER) ×3 IMPLANT
GLOVE BIO SURGEON STRL SZ7 (GLOVE) ×2 IMPLANT
GLOVE BIOGEL PI IND STRL 7.0 (GLOVE) ×1 IMPLANT
GLOVE BIOGEL PI INDICATOR 7.0 (GLOVE) ×6
GLOVE ECLIPSE 6.5 STRL STRAW (GLOVE) ×2 IMPLANT
GLOVE SURG SS PI 7.5 STRL IVOR (GLOVE) ×3 IMPLANT
GOWN STRL REUS W/TWL LRG LVL3 (GOWN DISPOSABLE) ×9 IMPLANT
HEMOSTAT SNOW SURGICEL 2X4 (HEMOSTASIS) ×3 IMPLANT
INST SET LAPROSCOPIC AP (KITS) ×3 IMPLANT
IV NS IRRIG 3000ML ARTHROMATIC (IV SOLUTION) IMPLANT
KIT TURNOVER KIT A (KITS) ×3 IMPLANT
MANIFOLD NEPTUNE II (INSTRUMENTS) ×3 IMPLANT
NDL HYPO 18GX1.5 BLUNT FILL (NEEDLE) ×1 IMPLANT
NDL INSUFFLATION 14GA 120MM (NEEDLE) ×1 IMPLANT
NEEDLE HYPO 18GX1.5 BLUNT FILL (NEEDLE) ×3 IMPLANT
NEEDLE HYPO 22GX1.5 SAFETY (NEEDLE) ×3 IMPLANT
NEEDLE INSUFFLATION 14GA 120MM (NEEDLE) ×3 IMPLANT
NS IRRIG 1000ML POUR BTL (IV SOLUTION) ×3 IMPLANT
PACK LAP CHOLE LZT030E (CUSTOM PROCEDURE TRAY) ×3 IMPLANT
PAD ARMBOARD 7.5X6 YLW CONV (MISCELLANEOUS) ×3 IMPLANT
SET BASIN LINEN APH (SET/KITS/TRAYS/PACK) ×3 IMPLANT
SET TUBE IRRIG SUCTION NO TIP (IRRIGATION / IRRIGATOR) IMPLANT
SLEEVE ENDOPATH XCEL 5M (ENDOMECHANICALS) ×3 IMPLANT
SUT MNCRL AB 4-0 PS2 18 (SUTURE) ×5 IMPLANT
SUT VICRYL 0 UR6 27IN ABS (SUTURE) ×3 IMPLANT
SYR 20CC LL (SYRINGE) ×3 IMPLANT
SYS BAG RETRIEVAL 10MM (BASKET) ×1
SYSTEM BAG RETRIEVAL 10MM (BASKET) ×1 IMPLANT
TROCAR ENDO BLADELESS 11MM (ENDOMECHANICALS) ×3 IMPLANT
TROCAR XCEL NON-BLD 5MMX100MML (ENDOMECHANICALS) ×3 IMPLANT
TROCAR XCEL UNIV SLVE 11M 100M (ENDOMECHANICALS) ×3 IMPLANT
TUBE CONNECTING 12'X1/4 (SUCTIONS) ×1
TUBE CONNECTING 12X1/4 (SUCTIONS) ×2 IMPLANT
WARMER LAPAROSCOPE (MISCELLANEOUS) ×3 IMPLANT

## 2019-05-11 NOTE — Transfer of Care (Signed)
Immediate Anesthesia Transfer of Care Note  Patient: Emily Weeks  Procedure(s) Performed: LAPAROSCOPIC CHOLECYSTECTOMY (N/A Abdomen)  Patient Location: PACU  Anesthesia Type:General  Level of Consciousness: oriented, drowsy and patient cooperative  Airway & Oxygen Therapy: Patient Spontanous Breathing  Post-op Assessment: Report given to RN and Post -op Vital signs reviewed and stable  Post vital signs: Reviewed and stable  Last Vitals:  Vitals Value Taken Time  BP 154/77 05/11/19 1106  Temp    Pulse 61 05/11/19 1111  Resp 11 05/11/19 1111  SpO2 100 % 05/11/19 1111  Vitals shown include unvalidated device data.  Last Pain:  Vitals:   05/11/19 0912  TempSrc: Oral  PainSc: 5       Patients Stated Pain Goal: 8 (92/90/90 3014)  Complications: No apparent anesthesia complications

## 2019-05-11 NOTE — Anesthesia Postprocedure Evaluation (Signed)
Anesthesia Post Note Late Entry for 1130  Patient: Emily Weeks  Procedure(s) Performed: LAPAROSCOPIC CHOLECYSTECTOMY (N/A Abdomen)  Patient location during evaluation: PACU Anesthesia Type: General Level of consciousness: awake and alert and oriented Pain management: pain level controlled Vital Signs Assessment: post-procedure vital signs reviewed and stable Respiratory status: spontaneous breathing Cardiovascular status: stable Postop Assessment: no apparent nausea or vomiting Anesthetic complications: no     Last Vitals:  Vitals:   05/11/19 1158 05/11/19 1159  BP: 138/76   Pulse: 61 (!) 59  Resp: 13 11  Temp:    SpO2: 96% 99%    Last Pain:  Vitals:   05/11/19 1242  TempSrc:   PainSc: 4                  Juliani Laduke A

## 2019-05-11 NOTE — Interval H&P Note (Signed)
History and Physical Interval Note:  05/11/2019 9:36 AM  Emily Weeks  has presented today for surgery, with the diagnosis of chronic cholecystitis.  The various methods of treatment have been discussed with the patient and family. After consideration of risks, benefits and other options for treatment, the patient has consented to  Procedure(s): LAPAROSCOPIC CHOLECYSTECTOMY (N/A) as a surgical intervention.  The patient's history has been reviewed, patient examined, no change in status, stable for surgery.  I have reviewed the patient's chart and labs.  Questions were answered to the patient's satisfaction.     Aviva Signs

## 2019-05-11 NOTE — Care Management Important Message (Signed)
Important Message  Patient Details  Name: Emily Weeks MRN: [id number] Date of Birth: 1971-04-27   Medicare Important Message Given:  Yes     Tommy Medal 05/11/2019, 2:58 PM

## 2019-05-11 NOTE — Op Note (Signed)
Patient:  Emily Weeks  DOB:  [birth year]  MRN:  [id number]   Preop Diagnosis: Chronic cholecystitis  Postop Diagnosis: Same  Procedure: Laparoscopic cholecystectomy  Surgeon: Aviva Signs, MD  Anes: General endotracheal  Indications: Patient is a 48 year old white female who presents with biliary colic secondary to chronic cholecystitis.  The risks and benefits of the procedure including bleeding, infection, hepatobiliary injury, and the possibility of an open procedure were fully explained to the patient, who gave informed consent.  Procedure note: The patient was placed in the supine position.  After induction of general endotracheal anesthesia, the abdomen was prepped and draped using the usual sterile technique with ChloraPrep.  Surgical site confirmation was performed.  A supraumbilical incision was made down to the fascia.  A Veress needle was introduced into the abdominal cavity and confirmation of placement was done using the saline drop test.  The abdomen was then insufflated to 16 mmHg pressure.  An 11 mm trocar was introduced into the abdominal cavity under direct visualization without difficulty.  The patient was placed in reverse Trendelenburg position and an additional 11 mm trocar was placed in the epigastric region and 5 mm trochars were placed the right upper quadrant and right flank regions.  The liver was inspected and noted to be somewhat enlarged but within normal limits.  The gallbladder was retracted in a dynamic fashion in order to provide a critical view of the triangle of Calot.  The cystic duct was first identified.  Its juncture to the infundibulum was fully identified.  Endoclips were placed proximally and distally on the cystic duct, and the cystic duct was divided.  This was likewise done with the cystic artery.  The gallbladder was freed away from the gallbladder fossa using Bovie electrocautery.  The gallbladder was delivered through the epigastric trocar site  using an Endo Catch bag.  The gallbladder fossa was inspected and no abnormal bleeding or bile leakage was noted.  Surgicel was placed in the gallbladder fossa.  All fluid and air were then evacuated from the abdominal cavity prior to removal of the trochars.  All wounds were irrigated with normal saline.  All wounds were injected with Exparel.  All wounds were closed using a 4-0 Monocryl subcuticular suture.  Dermabond was applied.  All tape and needle counts were correct at the end of the procedure.  The patient was extubated in the operating room and transferred to PACU in stable condition.  Complications: None  EBL: Minimal  Specimen: Gallbladder

## 2019-05-11 NOTE — Discharge Instructions (Signed)
Laparoscopic Cholecystectomy, Care After This sheet gives you information about how to care for yourself after your procedure. Your health care provider may also give you more specific instructions. If you have problems or questions, contact your health care provider. What can I expect after the procedure? After the procedure, it is common to have:  Pain at your incision sites. You will be given medicines to control this pain.  Mild nausea or vomiting.  Bloating and possible shoulder pain from the air-like gas that was used during the procedure. Follow these instructions at home: Incision care   Follow instructions from your health care provider about how to take care of your incisions. Make sure you: ? Wash your hands with soap and water before you change your bandage (dressing). If soap and water are not available, use hand sanitizer. ? Change your dressing as told by your health care provider. ? Leave stitches (sutures), skin glue, or adhesive strips in place. These skin closures may need to be in place for 2 weeks or longer. If adhesive strip edges start to loosen and curl up, you may trim the loose edges. Do not remove adhesive strips completely unless your health care provider tells you to do that.  Do not take baths, swim, or use a hot tub until your health care provider approves. Ask your health care provider if you can take showers. You may only be allowed to take sponge baths for bathing.  Check your incision area every day for signs of infection. Check for: ? More redness, swelling, or pain. ? More fluid or blood. ? Warmth. ? Pus or a bad smell. Activity  Do not drive or use heavy machinery while taking prescription pain medicine.  Do not lift anything that is heavier than 10 lb (4.5 kg) until your health care provider approves.  Do not play contact sports until your health care provider approves.  Do not drive for 24 hours if you were given a medicine to help you relax  (sedative).  Rest as needed. Do not return to work or school until your health care provider approves. General instructions  Take over-the-counter and prescription medicines only as told by your health care provider.  To prevent or treat constipation while you are taking prescription pain medicine, your health care provider may recommend that you: ? Drink enough fluid to keep your urine clear or pale yellow. ? Take over-the-counter or prescription medicines. ? Eat foods that are high in fiber, such as fresh fruits and vegetables, whole grains, and beans. ? Limit foods that are high in fat and processed sugars, such as fried and sweet foods. Contact a health care provider if:  You develop a rash.  You have more redness, swelling, or pain around your incisions.  You have more fluid or blood coming from your incisions.  Your incisions feel warm to the touch.  You have pus or a bad smell coming from your incisions.  You have a fever.  One or more of your incisions breaks open. Get help right away if:  You have trouble breathing.  You have chest pain.  You have increasing pain in your shoulders.  You faint or feel dizzy when you stand.  You have severe pain in your abdomen.  You have nausea or vomiting that lasts for more than one day.  You have leg pain. This information is not intended to replace advice given to you by your health care provider. Make sure you discuss any questions you have  with your health care provider. Document Released: 11/08/2005 Document Revised: 05/29/2016 Document Reviewed: 04/26/2016 Elsevier Interactive Patient Education  2019 [employer] If you have a gallbladder condition, you may have trouble digesting fats. Eating a low-fat diet can help reduce your symptoms, and may be helpful before and after having surgery to remove your gallbladder (cholecystectomy). Your health care provider may recommend that you work with a  diet and nutrition specialist (dietitian) to help you reduce the amount of fat in your diet. What are tips for following this plan? General guidelines  Limit your fat intake to less than 30% of your total daily calories. If you eat around 1,800 calories each day, this is less than 60 grams (g) of fat per day.  Fat is an important part of a healthy diet. Eating a low-fat diet can make it hard to maintain a healthy body weight. Ask your dietitian how much fat, calories, and other nutrients you need each day.  Eat small, frequent meals throughout the day instead of three large meals.  Drink at least 8-10 cups of fluid a day. Drink enough fluid to keep your urine clear or pale yellow.  Limit alcohol intake to no more than 1 drink a day for nonpregnant women and 2 drinks a day for men. One drink equals 12 oz of beer, 5 oz of wine, or 1 oz of hard liquor. Reading food labels  Check Nutrition Facts on food labels for the amount of fat per serving. Choose foods with less than 3 grams of fat per serving. Shopping  Choose nonfat and low-fat healthy foods. Look for the words nonfat, low fat, or fat free.  Avoid buying processed or prepackaged foods. Cooking  Cook using low-fat methods, such as baking, broiling, grilling, or boiling.  Cook with small amounts of healthy fats, such as olive oil, grapeseed oil, canola oil, or sunflower oil. What foods are recommended?   All fresh, frozen, or canned fruits and vegetables.  Whole grains.  Low-fat or non-fat (skim) milk and yogurt.  Lean meat, skinless poultry, fish, eggs, and beans.  Low-fat protein supplement powders or drinks.  Spices and herbs. What foods are not recommended?  High-fat foods. These include baked goods, fast food, fatty cuts of meat, ice cream, french toast, sweet rolls, pizza, cheese bread, foods covered with butter, creamy sauces, or cheese.  Fried foods. These include french fries, tempura, battered fish,  breaded chicken, fried breads, and sweets.  Foods with strong odors.  Foods that cause bloating and gas. Summary  A low-fat diet can be helpful if you have a gallbladder condition, or before and after gallbladder surgery.  Limit your fat intake to less than 30% of your total daily calories. This is about 60 g of fat if you eat 1,800 calories each day.  Eat small, frequent meals throughout the day instead of three large meals. This information is not intended to replace advice given to you by your health care provider. Make sure you discuss any questions you have with your health care provider. Document Released: 11/13/2013 Document Revised: 12/16/2016 Document Reviewed: 12/16/2016 Elsevier Interactive Patient Education  2019 [employer].

## 2019-05-11 NOTE — Anesthesia Procedure Notes (Signed)
Procedure Name: Intubation Date/Time: 05/11/2019 10:17 AM Performed by: Andree Elk, Amy A, CRNA Pre-anesthesia Checklist: Patient identified, Patient being monitored, Timeout performed, Emergency Drugs available and Suction available Patient Re-evaluated:Patient Re-evaluated prior to induction Oxygen Delivery Method: Circle system utilized Preoxygenation: Pre-oxygenation with 100% oxygen Induction Type: IV induction Ventilation: Mask ventilation without difficulty Laryngoscope Size: 3 and Glidescope Grade View: Grade I Tube type: Oral Tube size: 7.0 mm Number of attempts: 1 Airway Equipment and Method: Stylet and Video-laryngoscopy Placement Confirmation: ETT inserted through vocal cords under direct vision,  positive ETCO2 and breath sounds checked- equal and bilateral Secured at: 21 cm Tube secured with: Tape Dental Injury: Teeth and Oropharynx as per pre-operative assessment

## 2019-05-11 NOTE — Anesthesia Preprocedure Evaluation (Signed)
Anesthesia Evaluation  Patient identified by MRN, date of birth, ID band Patient awake    Reviewed: Allergy & Precautions, NPO status , Patient's Chart, lab work & pertinent test results  History of Anesthesia Complications (+) PSEUDOCHOLINESTERASE DEFICIENCY and history of anesthetic complications  Airway Mallampati: III  TM Distance: >3 FB Neck ROM: Full    Dental no notable dental hx. (+) Partial Lower   Pulmonary neg pulmonary ROS, Current Smoker,    Pulmonary exam normal breath sounds clear to auscultation       Cardiovascular Exercise Tolerance: Good negative cardio ROS Normal cardiovascular examI Rhythm:Regular Rate:Normal     Neuro/Psych  Headaches, Anxiety Depression Newly started buspar and prozacnegative psych ROS   GI/Hepatic GERD  Medicated and Controlled,(+) Hepatitis -, Unspecified  Endo/Other  negative endocrine ROS  Renal/GU negative Renal ROS  negative genitourinary   Musculoskeletal  (+) Arthritis ,   Abdominal   Peds negative pediatric ROS (+)  Hematology negative hematology ROS (+)   Anesthesia Other Findings   Reproductive/Obstetrics negative OB ROS                             Anesthesia Physical Anesthesia Plan  ASA: III  Anesthesia Plan: General   Post-op Pain Management:    Induction: Intravenous  PONV Risk Score and Plan: 2 and Ondansetron, Dexamethasone and Treatment may vary due to age or medical condition  Airway Management Planned: Oral ETT  Additional Equipment:   Intra-op Plan:   Post-operative Plan: Extubation in OR  Informed Consent: I have reviewed the patients History and Physical, chart, labs and discussed the procedure including the risks, benefits and alternatives for the proposed anesthesia with the patient or authorized representative who has indicated his/her understanding and acceptance.     Dental advisory given  Plan  Discussed with: CRNA  Anesthesia Plan Comments: (Plan Full PPE Plan GETA -NO SUX-WTP after Q&A)        Anesthesia Quick Evaluation
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
## 2019-05-14 ENCOUNTER — Encounter (HOSPITAL_COMMUNITY): Payer: Self-pay | Admitting: General Surgery

## 2019-05-17 ENCOUNTER — Telehealth: Payer: Self-pay | Admitting: General Surgery

## 2019-05-17 ENCOUNTER — Other Ambulatory Visit: Payer: Self-pay

## 2019-05-17 ENCOUNTER — Telehealth (INDEPENDENT_AMBULATORY_CARE_PROVIDER_SITE_OTHER): Payer: Self-pay | Admitting: General Surgery

## 2019-05-17 DIAGNOSIS — Z09 Encounter for follow-up examination after completed treatment for conditions other than malignant neoplasm: Secondary | ICD-10-CM

## 2019-05-17 NOTE — Telephone Encounter (Signed)
Virtual postoperative visit performed by telephone.  Patient states she is doing well.  She is pleased with the results.  She has minimal incisional pain.  She states her incisions are healing well.  Her preoperative symptoms have resolved.  No nausea, vomiting, or fevers are noted.  She was instructed to call me should she have any issues.  Follow-up as needed.

## 2019-05-18 ENCOUNTER — Other Ambulatory Visit: Payer: Self-pay

## 2019-05-21 ENCOUNTER — Ambulatory Visit (INDEPENDENT_AMBULATORY_CARE_PROVIDER_SITE_OTHER): Payer: Medicare HMO | Admitting: Family Medicine

## 2019-05-21 ENCOUNTER — Encounter: Payer: Self-pay | Admitting: Family Medicine

## 2019-05-21 ENCOUNTER — Ambulatory Visit: Payer: Medicare HMO | Admitting: Family Medicine

## 2019-05-21 ENCOUNTER — Other Ambulatory Visit: Payer: Self-pay

## 2019-05-21 VITALS — BP 112/68 | HR 68 | Temp 97.6°F | Ht 63.0 in | Wt 183.0 lb

## 2019-05-21 DIAGNOSIS — Z9049 Acquired absence of other specified parts of digestive tract: Secondary | ICD-10-CM

## 2019-05-21 DIAGNOSIS — F339 Major depressive disorder, recurrent, unspecified: Secondary | ICD-10-CM | POA: Diagnosis not present

## 2019-05-21 DIAGNOSIS — F411 Generalized anxiety disorder: Secondary | ICD-10-CM | POA: Insufficient documentation

## 2019-05-21 DIAGNOSIS — Z09 Encounter for follow-up examination after completed treatment for conditions other than malignant neoplasm: Secondary | ICD-10-CM

## 2019-05-21 DIAGNOSIS — Z79899 Other long term (current) drug therapy: Secondary | ICD-10-CM

## 2019-05-21 MED ORDER — ALPRAZOLAM 0.25 MG PO TABS: 0.2500 mg | ORAL_TABLET | Freq: Three times a day (TID) | ORAL | 3 refills | Status: DC | PRN
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
## 2019-05-21 NOTE — Patient Instructions (Signed)
It was a pleasure seeing you today, Ajee.  Information regarding what we discussed is included in this packet.  Please make an appointment to see me in 3 months.   In a few days you may receive a survey in the mail or online from [employer] regarding your visit with [place] today. Please take a moment to fill this out. Your feedback is very important to our office. It can help [place] better understand your needs as well as improve your experience and satisfaction. Thank you for taking your time to complete it. We care about you.  Because of recent events of COVID-19 ("Coronavirus"), please follow CDC recommendations:   1. Wash your hand frequently 2. Avoid touching your face 3. Stay away from people who are sick 4. If you have symptoms such as fever, cough, shortness of breath then call your healthcare provider for further guidance 5. If you are sick, STAY AT HOME, unless otherwise directed by your healthcare provider. 6. Follow directions from state and national officials regarding staying safe    Please feel free to call our office if any questions or concerns arise.  Warm Regards, Monia Pouch, FNP-C [facility] [address] [place], [place] [address] [phone]

## 2019-05-23 LAB — TOXASSURE SELECT 13 (MW), URINE

## 2019-05-24 ENCOUNTER — Encounter: Payer: Self-pay | Admitting: Family Medicine

## 2019-05-24 ENCOUNTER — Other Ambulatory Visit: Payer: Self-pay | Admitting: Family Medicine

## 2019-05-24 ENCOUNTER — Ambulatory Visit: Payer: Medicare HMO | Admitting: Orthopaedic Surgery

## 2019-05-24 ENCOUNTER — Other Ambulatory Visit: Payer: Self-pay

## 2019-05-24 DIAGNOSIS — Z9114 Patient's other noncompliance with medication regimen: Secondary | ICD-10-CM

## 2019-05-24 NOTE — Progress Notes (Signed)
Toxassure positive for THC.Xanax prescription discontinued. Letter mailed to pt.

## 2019-06-19 DIAGNOSIS — H5213 Myopia, bilateral: Secondary | ICD-10-CM | POA: Diagnosis not present

## 2019-06-19 DIAGNOSIS — H52209 Unspecified astigmatism, unspecified eye: Secondary | ICD-10-CM | POA: Diagnosis not present

## 2019-06-26 ENCOUNTER — Ambulatory Visit (HOSPITAL_COMMUNITY): Admit: 2019-06-26 | Payer: Medicare HMO | Admitting: Internal Medicine

## 2019-06-26 ENCOUNTER — Encounter (HOSPITAL_COMMUNITY): Payer: Self-pay

## 2019-06-26 SURGERY — EGD (ESOPHAGOGASTRODUODENOSCOPY)
Anesthesia: Moderate Sedation

## 2019-07-13 ENCOUNTER — Encounter: Payer: Medicare HMO | Admitting: *Deleted

## 2019-11-09 ENCOUNTER — Ambulatory Visit: Payer: Medicare HMO | Admitting: Gastroenterology

## 2020-09-29 ENCOUNTER — Other Ambulatory Visit: Payer: Self-pay

## 2020-09-29 ENCOUNTER — Ambulatory Visit (INDEPENDENT_AMBULATORY_CARE_PROVIDER_SITE_OTHER): Payer: Medicare HMO | Admitting: Family Medicine

## 2020-09-29 ENCOUNTER — Encounter: Payer: Self-pay | Admitting: Family Medicine

## 2020-09-29 VITALS — BP 134/84 | HR 87 | Temp 97.9°F | Ht 63.0 in | Wt 173.4 lb

## 2020-09-29 DIAGNOSIS — R1011 Right upper quadrant pain: Secondary | ICD-10-CM | POA: Diagnosis not present

## 2020-09-29 DIAGNOSIS — M2550 Pain in unspecified joint: Secondary | ICD-10-CM

## 2020-09-29 DIAGNOSIS — Z6830 Body mass index (BMI) 30.0-30.9, adult: Secondary | ICD-10-CM

## 2020-09-29 DIAGNOSIS — Z23 Encounter for immunization: Secondary | ICD-10-CM | POA: Diagnosis not present

## 2020-09-29 DIAGNOSIS — F1920 Other psychoactive substance dependence, uncomplicated: Secondary | ICD-10-CM | POA: Diagnosis not present

## 2020-09-29 DIAGNOSIS — Z1159 Encounter for screening for other viral diseases: Secondary | ICD-10-CM | POA: Diagnosis not present

## 2020-09-29 DIAGNOSIS — F339 Major depressive disorder, recurrent, unspecified: Secondary | ICD-10-CM

## 2020-09-29 DIAGNOSIS — E785 Hyperlipidemia, unspecified: Secondary | ICD-10-CM

## 2020-09-29 DIAGNOSIS — F411 Generalized anxiety disorder: Secondary | ICD-10-CM | POA: Diagnosis not present

## 2020-09-29 DIAGNOSIS — Z7289 Other problems related to lifestyle: Secondary | ICD-10-CM | POA: Diagnosis not present

## 2020-09-29 MED ORDER — ESCITALOPRAM OXALATE 10 MG PO TABS: ORAL_TABLET | ORAL | 3 refills | Status: DC

## 2020-09-29 MED ORDER — HYDROXYZINE PAMOATE 25 MG PO CAPS: ORAL_CAPSULE | ORAL | 1 refills | Status: DC

## 2020-09-29 NOTE — Addendum Note (Signed)
Addended by: Karle Plumber on: 09/29/2020 02:37 PM   Modules accepted: Orders
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
## 2020-09-29 NOTE — Patient Instructions (Signed)
Generalized Anxiety Disorder, Adult Generalized anxiety disorder (GAD) is a mental health disorder. People with this condition constantly worry about everyday events. Unlike normal anxiety, worry related to GAD is not triggered by a specific event. These worries also do not fade or get better with time. GAD interferes with life functions, including relationships, work, and school. GAD can vary from mild to severe. People with severe GAD can have intense waves of anxiety with physical symptoms (panic attacks). What are the causes? The exact cause of GAD is not known. What increases the risk? This condition is more likely to develop in:  Women.  People who have a family history of anxiety disorders.  People who are very shy.  People who experience very stressful life events, such as the death of a loved one.  People who have a very stressful family environment. What are the signs or symptoms? People with GAD often worry excessively about many things in their lives, such as their health and family. They may also be overly concerned about:  Doing well at work.  Being on time.  Natural disasters.  Friendships. Physical symptoms of GAD include:  Fatigue.  Muscle tension or having muscle twitches.  Trembling or feeling shaky.  Being easily startled.  Feeling like your heart is pounding or racing.  Feeling out of breath or like you cannot take a deep breath.  Having trouble falling asleep or staying asleep.  Sweating.  Nausea, diarrhea, or irritable bowel syndrome (IBS).  Headaches.  Trouble concentrating or remembering facts.  Restlessness.  Irritability. How is this diagnosed? Your health care provider can diagnose GAD based on your symptoms and medical history. You will also have a physical exam. The health care provider will ask specific questions about your symptoms, including how severe they are, when they started, and if they come and go. Your health care  provider may ask you about your use of alcohol or drugs, including prescription medicines. Your health care provider may refer you to a mental health specialist for further evaluation. Your health care provider will do a thorough examination and may perform additional tests to rule out other possible causes of your symptoms. To be diagnosed with GAD, a person must have anxiety that:  Is out of his or her control.  Affects several different aspects of his or her life, such as work and relationships.  Causes distress that makes him or her unable to take part in normal activities.  Includes at least three physical symptoms of GAD, such as restlessness, fatigue, trouble concentrating, irritability, muscle tension, or sleep problems. Before your health care provider can confirm a diagnosis of GAD, these symptoms must be present more days than they are not, and they must last for six months or longer. How is this treated? The following therapies are usually used to treat GAD:  Medicine. Antidepressant medicine is usually prescribed for long-term daily control. Antianxiety medicines may be added in severe cases, especially when panic attacks occur.  Talk therapy (psychotherapy). Certain types of talk therapy can be helpful in treating GAD by providing support, education, and guidance. Options include: ? Cognitive behavioral therapy (CBT). People learn coping skills and techniques to ease their anxiety. They learn to identify unrealistic or negative thoughts and behaviors and to replace them with positive ones. ? Acceptance and commitment therapy (ACT). This treatment teaches people how to be mindful as a way to cope with unwanted thoughts and feelings. ? Biofeedback. This process trains you to manage your body's response (  physiological response) through breathing techniques and relaxation methods. You will work with a therapist while machines are used to monitor your physical symptoms.  Stress  management techniques. These include yoga, meditation, and exercise. A mental health specialist can help determine which treatment is best for you. Some people see improvement with one type of therapy. However, other people require a combination of therapies. Follow these instructions at home:  Take over-the-counter and prescription medicines only as told by your health care provider.  Try to maintain a normal routine.  Try to anticipate stressful situations and allow extra time to manage them.  Practice any stress management or self-calming techniques as taught by your health care provider.  Do not punish yourself for setbacks or for not making progress.  Try to recognize your accomplishments, even if they are small.  Keep all follow-up visits as told by your health care provider. This is important. Contact a health care provider if:  Your symptoms do not get better.  Your symptoms get worse.  You have signs of depression, such as: ? A persistently sad, cranky, or irritable mood. ? Loss of enjoyment in activities that used to bring you joy. ? Change in weight or eating. ? Changes in sleeping habits. ? Avoiding friends or family members. ? Loss of energy for normal tasks. ? Feelings of guilt or worthlessness. Get help right away if:  You have serious thoughts about hurting yourself or others. If you ever feel like you may hurt yourself or others, or have thoughts about taking your own life, get help right away. You can go to your nearest emergency department or call:  Your local emergency services (911 in the [place]  A suicide crisis helpline, such as the [place] at 636-268-9325. This is open 24 hours a day. Summary  Generalized anxiety disorder (GAD) is a mental health disorder that involves worry that is not triggered by a specific event.  People with GAD often worry excessively about many things in their lives, such as their health and  family.  GAD may cause physical symptoms such as restlessness, trouble concentrating, sleep problems, frequent sweating, nausea, diarrhea, headaches, and trembling or muscle twitching.  A mental health specialist can help determine which treatment is best for you. Some people see improvement with one type of therapy. However, other people require a combination of therapies. This information is not intended to replace advice given to you by your health care provider. Make sure you discuss any questions you have with your health care provider. Document Revised: 10/21/2017 Document Reviewed: 09/28/2016 Elsevier Patient Education  [place]. Major Depressive Disorder, Adult Major depressive disorder (MDD) is a mental health condition. MDD often makes you feel sad, hopeless, or helpless. MDD can also cause symptoms in your body. MDD can affect your:  Work.  School.  Relationships.  Other normal activities. MDD can range from mild to very bad. It may occur once (single episode MDD). It can also occur many times (recurrent MDD). The main symptoms of MDD often include:  Feeling sad, depressed, or irritable most of the time.  Loss of interest. MDD symptoms also include:  Sleeping too much or too little.  Eating too much or too little.  A change in your weight.  Feeling tired (fatigue) or having low energy.  Feeling worthless.  Feeling guilty.  Trouble making decisions.  Trouble thinking clearly.  Thoughts of suicide or harming others.  Feeling weak.  Feeling agitated.  Keeping yourself from being  around other people (isolation). Follow these instructions at home: Activity  Do these things as told by your doctor: ? Go back to your normal activities. ? Exercise regularly. ? Spend time outdoors. Alcohol  Talk with your doctor about how alcohol can affect your antidepressant medicines.  Do not drink alcohol. Or, limit how much alcohol you drink. ? This means  no more than 1 drink a day for nonpregnant women and 2 drinks a day for men. One drink equals one of these:  12 oz of beer.  5 oz of wine.  1 oz of hard liquor. General instructions  Take over-the-counter and prescription medicines only as told by your doctor.  Eat a healthy diet.  Get plenty of sleep.  Find activities that you enjoy. Make time to do them.  Think about joining a support group. Your doctor may be able to suggest a group for you.  Keep all follow-up visits as told by your doctor. This is important. Where to find more information:  [employer] on Mental Illness: ? www.nami.[place]: ? https://carter.com/  National Suicide Prevention Lifeline: ? (318) 412-8196. This is free, 24-hour help. Contact a doctor if:  Your symptoms get worse.  You have new symptoms. Get help right away if:  You self-harm.  You see, hear, taste, smell, or feel things that are not present (hallucinate). If you ever feel like you may hurt yourself or others, or have thoughts about taking your own life, get help right away. You can go to your nearest emergency department or call:  Your local emergency services (911 in the [place]  A suicide crisis helpline, such as the National Suicide Prevention Lifeline: ? 864-430-0842. This is open 24 hours a day. This information is not intended to replace advice given to you by your health care provider. Make sure you discuss any questions you have with your health care provider. Document Revised: 10/21/2017 Document Reviewed: 07/25/2016 Elsevier Patient Education  2020 [employer].

## 2020-09-30 LAB — CMP14+EGFR
ALT: 11 IU/L (ref 0–32)
AST: 15 IU/L (ref 0–40)
Albumin/Globulin Ratio: 2.2 (ref 1.2–2.2)
Albumin: 4.6 g/dL (ref 3.8–4.8)
Alkaline Phosphatase: 72 IU/L (ref 44–121)
BUN/Creatinine Ratio: 11 (ref 9–23)
BUN: 10 mg/dL (ref 6–24)
Bilirubin Total: 0.2 mg/dL (ref 0.0–1.2)
CO2: 27 mmol/L (ref 20–29)
Calcium: 9.6 mg/dL (ref 8.7–10.2)
Chloride: 102 mmol/L (ref 96–106)
Creatinine, Ser: 0.91 mg/dL (ref 0.57–1.00)
GFR calc Af Amer: 86 mL/min/{1.73_m2} (ref >59)
GFR calc non Af Amer: 75 mL/min/{1.73_m2} (ref >59)
Globulin, Total: 2.1 g/dL (ref 1.5–4.5)
Glucose: 89 mg/dL (ref 65–99)
Potassium: 4.3 mmol/L (ref 3.5–5.2)
Sodium: 138 mmol/L (ref 134–144)
Total Protein: 6.7 g/dL (ref 6.0–8.5)

## 2020-09-30 LAB — ARTHRITIS PANEL
Basophils Absolute: 0.1 10*3/uL (ref 0.0–0.2)
Basos: 1 %
EOS (ABSOLUTE): 0.2 10*3/uL (ref 0.0–0.4)
Eos: 1 %
Hematocrit: 40.7 % (ref 34.0–46.6)
Hemoglobin: 14.4 g/dL (ref 11.1–15.9)
Immature Grans (Abs): 0 10*3/uL (ref 0.0–0.1)
Immature Granulocytes: 0 %
Lymphocytes Absolute: 5.1 10*3/uL — ABNORMAL HIGH (ref 0.7–3.1)
Lymphs: 49 %
MCH: 30.9 pg (ref 26.6–33.0)
MCHC: 35.4 g/dL (ref 31.5–35.7)
MCV: 87 fL (ref 79–97)
Monocytes Absolute: 0.7 10*3/uL (ref 0.1–0.9)
Monocytes: 6 %
Neutrophils Absolute: 4.5 10*3/uL (ref 1.4–7.0)
Neutrophils: 43 %
Platelets: 367 10*3/uL (ref 150–450)
RBC: 4.66 x10E6/uL (ref 3.77–5.28)
RDW: 12.8 % (ref 11.7–15.4)
Rheumatoid fact SerPl-aCnc: <10 IU/mL (ref 0.0–13.9)
Sed Rate: 15 mm/hr (ref 0–32)
Uric Acid: 6.2 mg/dL (ref 2.6–6.2)
WBC: 10.5 10*3/uL (ref 3.4–10.8)

## 2020-09-30 LAB — LIPID PANEL
Chol/HDL Ratio: 7.4 ratio — ABNORMAL HIGH (ref 0.0–4.4)
Cholesterol, Total: 297 mg/dL — ABNORMAL HIGH (ref 100–199)
HDL: 40 mg/dL (ref >39)
LDL Chol Calc (NIH): 226 mg/dL — ABNORMAL HIGH (ref 0–99)
Triglycerides: 161 mg/dL — ABNORMAL HIGH (ref 0–149)
VLDL Cholesterol Cal: 31 mg/dL (ref 5–40)

## 2020-09-30 LAB — THYROID PANEL WITH TSH
Free Thyroxine Index: 2 (ref 1.2–4.9)
T3 Uptake Ratio: 28 % (ref 24–39)
T4, Total: 7.2 ug/dL (ref 4.5–12.0)
TSH: 1.44 u[IU]/mL (ref 0.450–4.500)

## 2020-09-30 LAB — HEPATITIS C ANTIBODY: Hep C Virus Ab: <0.1 s/co ratio (ref 0.0–0.9)

## 2020-10-01 ENCOUNTER — Encounter: Payer: Self-pay | Admitting: Family Medicine

## 2020-10-01 ENCOUNTER — Other Ambulatory Visit: Payer: Self-pay | Admitting: *Deleted

## 2020-10-01 ENCOUNTER — Telehealth: Payer: Self-pay | Admitting: Family Medicine

## 2020-10-01 ENCOUNTER — Other Ambulatory Visit: Payer: Self-pay | Admitting: Family Medicine

## 2020-10-01 DIAGNOSIS — F339 Major depressive disorder, recurrent, unspecified: Secondary | ICD-10-CM

## 2020-10-01 DIAGNOSIS — F411 Generalized anxiety disorder: Secondary | ICD-10-CM

## 2020-10-01 MED ORDER — EZETIMIBE 10 MG PO TABS: 10.0000 mg | ORAL_TABLET | Freq: Every day | ORAL | 3 refills | Status: DC

## 2020-10-01 MED ORDER — ESCITALOPRAM OXALATE 20 MG PO TABS: 20.0000 mg | ORAL_TABLET | Freq: Every day | ORAL | 1 refills | Status: DC

## 2020-10-01 NOTE — Telephone Encounter (Signed)
Aware of results and recommendations

## 2020-10-01 NOTE — Telephone Encounter (Signed)
Pt was calling to discuss lab work, said that she missed a call

## 2020-10-06 ENCOUNTER — Ambulatory Visit (HOSPITAL_COMMUNITY)
Admission: RE | Admit: 2020-10-06 | Discharge: 2020-10-06 | Disposition: A | Discharge status: Home / Self Care | Payer: Medicare HMO | Source: Ambulatory Visit | Attending: Family Medicine | Admitting: Family Medicine

## 2020-10-06 ENCOUNTER — Other Ambulatory Visit: Payer: Self-pay

## 2020-10-06 DIAGNOSIS — R1011 Right upper quadrant pain: Secondary | ICD-10-CM | POA: Diagnosis not present

## 2020-11-03 ENCOUNTER — Ambulatory Visit: Payer: Medicare HMO | Admitting: Family Medicine

## 2020-11-07 ENCOUNTER — Encounter: Payer: Self-pay | Admitting: Family Medicine

## 2020-11-07 ENCOUNTER — Other Ambulatory Visit: Payer: Self-pay

## 2020-11-07 ENCOUNTER — Ambulatory Visit (INDEPENDENT_AMBULATORY_CARE_PROVIDER_SITE_OTHER): Payer: Medicare HMO | Admitting: Family Medicine

## 2020-11-07 VITALS — BP 121/78 | HR 81 | Temp 97.8°F | Ht 63.0 in | Wt 168.4 lb

## 2020-11-07 DIAGNOSIS — F339 Major depressive disorder, recurrent, unspecified: Secondary | ICD-10-CM

## 2020-11-07 DIAGNOSIS — E785 Hyperlipidemia, unspecified: Secondary | ICD-10-CM

## 2020-11-07 DIAGNOSIS — F411 Generalized anxiety disorder: Secondary | ICD-10-CM

## 2020-11-07 DIAGNOSIS — Z789 Other specified health status: Secondary | ICD-10-CM | POA: Diagnosis not present

## 2020-11-07 DIAGNOSIS — M2550 Pain in unspecified joint: Secondary | ICD-10-CM

## 2020-11-07 NOTE — Patient Instructions (Signed)
Generalized Anxiety Disorder, Adult Generalized anxiety disorder (GAD) is a mental health disorder. People with this condition constantly worry about everyday events. Unlike normal anxiety, worry related to GAD is not triggered by a specific event. These worries also do not fade or get better with time. GAD interferes with life functions, including relationships, work, and school. GAD can vary from mild to severe. People with severe GAD can have intense waves of anxiety with physical symptoms (panic attacks). What are the causes? The exact cause of GAD is not known. What increases the risk? This condition is more likely to develop in:  Women.  People who have a family history of anxiety disorders.  People who are very shy.  People who experience very stressful life events, such as the death of a loved one.  People who have a very stressful family environment. What are the signs or symptoms? People with GAD often worry excessively about many things in their lives, such as their health and family. They may also be overly concerned about:  Doing well at work.  Being on time.  Natural disasters.  Friendships. Physical symptoms of GAD include:  Fatigue.  Muscle tension or having muscle twitches.  Trembling or feeling shaky.  Being easily startled.  Feeling like your heart is pounding or racing.  Feeling out of breath or like you cannot take a deep breath.  Having trouble falling asleep or staying asleep.  Sweating.  Nausea, diarrhea, or irritable bowel syndrome (IBS).  Headaches.  Trouble concentrating or remembering facts.  Restlessness.  Irritability. How is this diagnosed? Your health care provider can diagnose GAD based on your symptoms and medical history. You will also have a physical exam. The health care provider will ask specific questions about your symptoms, including how severe they are, when they started, and if they come and go. Your health care  provider may ask you about your use of alcohol or drugs, including prescription medicines. Your health care provider may refer you to a mental health specialist for further evaluation. Your health care provider will do a thorough examination and may perform additional tests to rule out other possible causes of your symptoms. To be diagnosed with GAD, a person must have anxiety that:  Is out of his or her control.  Affects several different aspects of his or her life, such as work and relationships.  Causes distress that makes him or her unable to take part in normal activities.  Includes at least three physical symptoms of GAD, such as restlessness, fatigue, trouble concentrating, irritability, muscle tension, or sleep problems. Before your health care provider can confirm a diagnosis of GAD, these symptoms must be present more days than they are not, and they must last for six months or longer. How is this treated? The following therapies are usually used to treat GAD:  Medicine. Antidepressant medicine is usually prescribed for long-term daily control. Antianxiety medicines may be added in severe cases, especially when panic attacks occur.  Talk therapy (psychotherapy). Certain types of talk therapy can be helpful in treating GAD by providing support, education, and guidance. Options include: ? Cognitive behavioral therapy (CBT). People learn coping skills and techniques to ease their anxiety. They learn to identify unrealistic or negative thoughts and behaviors and to replace them with positive ones. ? Acceptance and commitment therapy (ACT). This treatment teaches people how to be mindful as a way to cope with unwanted thoughts and feelings. ? Biofeedback. This process trains you to manage your body's response (  physiological response) through breathing techniques and relaxation methods. You will work with a therapist while machines are used to monitor your physical symptoms.  Stress  management techniques. These include yoga, meditation, and exercise. A mental health specialist can help determine which treatment is best for you. Some people see improvement with one type of therapy. However, other people require a combination of therapies. Follow these instructions at home:  Take over-the-counter and prescription medicines only as told by your health care provider.  Try to maintain a normal routine.  Try to anticipate stressful situations and allow extra time to manage them.  Practice any stress management or self-calming techniques as taught by your health care provider.  Do not punish yourself for setbacks or for not making progress.  Try to recognize your accomplishments, even if they are small.  Keep all follow-up visits as told by your health care provider. This is important. Contact a health care provider if:  Your symptoms do not get better.  Your symptoms get worse.  You have signs of depression, such as: ? A persistently sad, cranky, or irritable mood. ? Loss of enjoyment in activities that used to bring you joy. ? Change in weight or eating. ? Changes in sleeping habits. ? Avoiding friends or family members. ? Loss of energy for normal tasks. ? Feelings of guilt or worthlessness. Get help right away if:  You have serious thoughts about hurting yourself or others. If you ever feel like you may hurt yourself or others, or have thoughts about taking your own life, get help right away. You can go to your nearest emergency department or call:  Your local emergency services (911 in the [place]  A suicide crisis helpline, such as the [place] at 607-025-9134. This is open 24 hours a day. Summary  Generalized anxiety disorder (GAD) is a mental health disorder that involves worry that is not triggered by a specific event.  People with GAD often worry excessively about many things in their lives, such as their health and  family.  GAD may cause physical symptoms such as restlessness, trouble concentrating, sleep problems, frequent sweating, nausea, diarrhea, headaches, and trembling or muscle twitching.  A mental health specialist can help determine which treatment is best for you. Some people see improvement with one type of therapy. However, other people require a combination of therapies. This information is not intended to replace advice given to you by your health care provider. Make sure you discuss any questions you have with your health care provider. Document Revised: 10/21/2017 Document Reviewed: 09/28/2016 Elsevier Patient Education  [employer]. Major Depressive Disorder, Adult Major depressive disorder (MDD) is a mental health condition. It may also be called clinical depression or unipolar depression. MDD usually causes feelings of sadness, hopelessness, or helplessness. MDD can also cause physical symptoms. It can interfere with work, school, relationships, and other everyday activities. MDD may be mild, moderate, or severe. It may occur once (single episode major depressive disorder) or it may occur multiple times (recurrent major depressive disorder). What are the causes? The exact cause of this condition is not known. MDD is most likely caused by a combination of things, which may include:  Genetic factors. These are traits that are passed along from parent to child.  Individual factors. Your personality, your behavior, and the way you handle your thoughts and feelings may contribute to MDD. This includes personality traits and behaviors learned from others.  Physical factors, such as: ? Differences in  the part of your brain that controls emotion. This part of your brain may be different than it is in people who do not have MDD. ? Long-term (chronic) medical or psychiatric illnesses.  Social factors. Traumatic experiences or major life changes may play a role in the development of  MDD. What increases the risk? This condition is more likely to develop in women. The following factors may also make you more likely to develop MDD:  A family history of depression.  Troubled family relationships.  Abnormally low levels of certain brain chemicals.  Traumatic events in childhood, especially abuse or the loss of a parent.  Being under a lot of stress, or long-term stress, especially from upsetting life experiences or losses.  A history of: ? Chronic physical illness. ? Other mental health disorders. ? Substance abuse.  Poor living conditions.  Experiencing social exclusion or discrimination on a regular basis. What are the signs or symptoms? The main symptoms of MDD typically include:  Constant depressed or irritable mood.  Loss of interest in things and activities. MDD symptoms may also include:  Sleeping or eating too much or too little.  Unexplained weight change.  Fatigue or low energy.  Feelings of worthlessness or guilt.  Difficulty thinking clearly or making decisions.  Thoughts of suicide or of harming others.  Physical agitation or weakness.  Isolation. Severe cases of MDD may also occur with other symptoms, such as:  Delusions or hallucinations, in which you imagine things that are not real (psychotic depression).  Low-level depression that lasts at least a year (chronic depression or persistent depressive disorder).  Extreme sadness and hopelessness (melancholic depression).  Trouble speaking and moving (catatonic depression). How is this diagnosed? This condition may be diagnosed based on:  Your symptoms.  Your medical history, including your mental health history. This may involve tests to evaluate your mental health. You may be asked questions about your lifestyle, including any drug and alcohol use, and how long you have had symptoms of MDD.  A physical exam.  Blood tests to rule out other conditions. You must have a  depressed mood and at least four other MDD symptoms most of the day, nearly every day in the same 2-week timeframe before your health care provider can confirm a diagnosis of MDD. How is this treated? This condition is usually treated by mental health professionals, such as psychologists, psychiatrists, and clinical social workers. You may need more than one type of treatment. Treatment may include:  Psychotherapy. This is also called talk therapy or counseling. Types of psychotherapy include: ? Cognitive behavioral therapy (CBT). This type of therapy teaches you to recognize unhealthy feelings, thoughts, and behaviors, and replace them with positive thoughts and actions. ? Interpersonal therapy (IPT). This helps you to improve the way you relate to and communicate with others. ? Family therapy. This treatment includes members of your family.  Medicine to treat anxiety and depression, or to help you control certain emotions and behaviors.  Lifestyle changes, such as: ? Limiting alcohol and drug use. ? Exercising regularly. ? Getting plenty of sleep. ? Making healthy eating choices. ? Spending more time outdoors.  Treatments involving stimulation of the brain can be used in situations with extremely severe symptoms, or when medicine or other therapies do not work over time. These treatments include electroconvulsive therapy, transcranial magnetic stimulation, and vagal nerve stimulation. Follow these instructions at home: Activity  Return to your normal activities as told by your health care provider.  Exercise regularly  and spend time outdoors as told by your health care provider. General instructions  Take over-the-counter and prescription medicines only as told by your health care provider.  Do not drink alcohol. If you drink alcohol, limit your alcohol intake to no more than 1 drink a day for nonpregnant women and 2 drinks a day for men. One drink equals 12 oz of beer, 5 oz of wine,  or 1 oz of hard liquor. Alcohol can affect any antidepressant medicines you are taking. Talk to your health care provider about your alcohol use.  Eat a healthy diet and get plenty of sleep.  Find activities that you enjoy doing, and make time to do them.  Consider joining a support group. Your health care provider may be able to recommend a support group.  Keep all follow-up visits as told by your health care provider. This is important. Where to find more information [employer] on Mental Illness  www.nami.org [place] National Institute of Mental Health  https://carter.com/ National Suicide Prevention Lifeline  1-800-273-TALK 747-690-7772). This is free, 24-hour help. Contact a health care provider if:  Your symptoms get worse.  You develop new symptoms. Get help right away if:  You self-harm.  You have serious thoughts about hurting yourself or others.  You see, hear, taste, smell, or feel things that are not present (hallucinate). This information is not intended to replace advice given to you by your health care provider. Make sure you discuss any questions you have with your health care provider. Document Revised: 10/21/2017 Document Reviewed: 05/19/2016 Elsevier Patient Education  2020 [employer].
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
## 2020-12-06 IMAGING — NM NUCLEAR MEDICINE HEPATOBILIARY IMAGING WITH GALLBLADDER EF
2 series · 12 of 12 positions shown · non-contrast
Comparison: None.

CLINICAL DATA: Right upper quadrant pain with nausea and vomiting

EXAM:
NUCLEAR MEDICINE HEPATOBILIARY IMAGING WITH GALLBLADDER EF
VIEWS:
Anterior right upper quadrant
RADIOPHARMACEUTICALS:  5.3 mCi Nc-77m  Choletec IV

[Series 1: biliary · 3.25mm/px · 6 of 60 frames shown]
[frame 6/60]
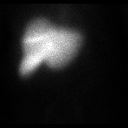
[frame 16/60]
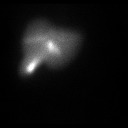
[frame 26/60]
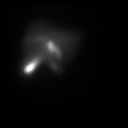
[frame 36/60]
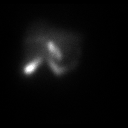
[frame 46/60]
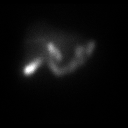
[frame 56/60]
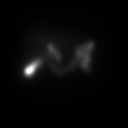

[Series 2: gbef · 3.25mm/px · 6 of 60 frames shown]
[frame 6/60]
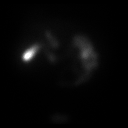
[frame 16/60]
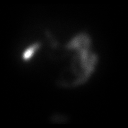
[frame 26/60]
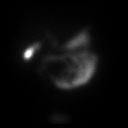
[frame 36/60]
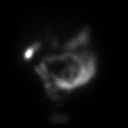
[frame 46/60]
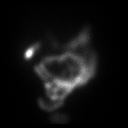
[frame 56/60]
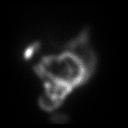

[12 of 12 positions shown; findings below may reference images not displayed]

FINDINGS: Liver uptake of radiotracer is unremarkable. There is prompt
visualization of gallbladder and small bowel, indicating patency of
the cystic and common bile ducts. The patient consumed 8 ounces of
Ensure orally with calculation of the computer generated ejection
fraction of radiotracer from gallbladder. The patient did not
experience clinical symptoms with the oral Ensure consumption. The
computer generated ejection fraction of radiotracer from the
gallbladder is normal at 63%, normal greater than 33% using the oral
agent.
IMPRESSION: Study within normal limits.

## 2020-12-10 ENCOUNTER — Encounter: Payer: Self-pay | Admitting: Family Medicine

## 2020-12-12 ENCOUNTER — Ambulatory Visit: Payer: Medicare HMO | Admitting: Nurse Practitioner

## 2021-02-06 ENCOUNTER — Other Ambulatory Visit: Payer: Self-pay

## 2021-02-06 ENCOUNTER — Ambulatory Visit (INDEPENDENT_AMBULATORY_CARE_PROVIDER_SITE_OTHER): Payer: Medicare HMO | Admitting: Family Medicine

## 2021-02-06 ENCOUNTER — Encounter: Payer: Self-pay | Admitting: Family Medicine

## 2021-02-06 VITALS — BP 109/47 | HR 74 | Temp 98.4°F | Ht 63.0 in | Wt 168.4 lb

## 2021-02-06 DIAGNOSIS — F411 Generalized anxiety disorder: Secondary | ICD-10-CM | POA: Diagnosis not present

## 2021-02-06 DIAGNOSIS — E785 Hyperlipidemia, unspecified: Secondary | ICD-10-CM

## 2021-02-06 DIAGNOSIS — Z1231 Encounter for screening mammogram for malignant neoplasm of breast: Secondary | ICD-10-CM

## 2021-02-06 DIAGNOSIS — F339 Major depressive disorder, recurrent, unspecified: Secondary | ICD-10-CM | POA: Diagnosis not present
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
## 2021-02-06 NOTE — Patient Instructions (Signed)

## 2021-06-03 ENCOUNTER — Ambulatory Visit (INDEPENDENT_AMBULATORY_CARE_PROVIDER_SITE_OTHER): Payer: Medicare HMO

## 2021-06-03 ENCOUNTER — Ambulatory Visit (INDEPENDENT_AMBULATORY_CARE_PROVIDER_SITE_OTHER): Payer: Medicare HMO | Admitting: Family Medicine

## 2021-06-03 ENCOUNTER — Encounter: Payer: Self-pay | Admitting: Family Medicine

## 2021-06-03 ENCOUNTER — Other Ambulatory Visit: Payer: Self-pay

## 2021-06-03 VITALS — BP 137/59 | HR 85 | Temp 97.9°F | Resp 20 | Ht 63.0 in | Wt 166.0 lb

## 2021-06-03 DIAGNOSIS — M25562 Pain in left knee: Secondary | ICD-10-CM

## 2021-06-03 DIAGNOSIS — F339 Major depressive disorder, recurrent, unspecified: Secondary | ICD-10-CM | POA: Diagnosis not present

## 2021-06-03 DIAGNOSIS — G8929 Other chronic pain: Secondary | ICD-10-CM

## 2021-06-03 DIAGNOSIS — F411 Generalized anxiety disorder: Secondary | ICD-10-CM | POA: Diagnosis not present

## 2021-06-03 DIAGNOSIS — E785 Hyperlipidemia, unspecified: Secondary | ICD-10-CM

## 2021-06-03 DIAGNOSIS — M25561 Pain in right knee: Secondary | ICD-10-CM

## 2021-06-03 MED ORDER — HYDROXYZINE PAMOATE 25 MG PO CAPS
ORAL_CAPSULE | ORAL | 1 refills | Status: DC
Start: 2021-06-03 — End: 2022-07-02

## 2021-06-03 MED ORDER — ESCITALOPRAM OXALATE 10 MG PO TABS
ORAL_TABLET | ORAL | 3 refills | Status: DC
Start: 2021-06-03 — End: 2022-02-25
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
## 2021-06-03 NOTE — Patient Instructions (Signed)
Chronic Knee Pain, Adult °Chronic knee pain is pain in one or both knees that lasts longer than 3 months. Symptoms of chronic knee pain may include swelling, stiffness, and discomfort. Age-related wear and tear (osteoarthritis) of the knee joint is the most common cause of chronic knee pain. Other possible causes include: °A long-term immune-related disease that causes inflammation of the knee (rheumatoid arthritis). This usually affects both knees. °Inflammatory arthritis, such as gout or pseudogout. °An injury to the knee that causes arthritis. °An injury to the knee that damages the ligaments. Ligaments are strong tissues that connect bones to each other. °Runner's knee or pain behind the kneecap. °Treatment for chronic knee pain depends on the cause. The main treatments for chronic knee pain are physical therapy and weight loss. This condition may also be treated with medicines, injections, a knee sleeve or brace, and by using crutches. Rest, ice, pressure (compression), and elevation, also known as RICE therapy, may also be recommended. °Follow these instructions at home: °If you have a knee sleeve or brace: ° °Wear the knee sleeve or brace as told by your health care provider. Remove it only as told by your health care provider. °Loosen it if your toes tingle, become numb, or turn cold and blue. °Keep it clean. °If the sleeve or brace is not waterproof: °Do not let it get wet. °Remove it if allowed by your health care provider, or cover it with a watertight covering when you take a bath or a shower. °Managing pain, stiffness, and swelling °  °If directed, apply heat to the affected area as often as told by your health care provider. Use the heat source that your health care provider recommends, such as a moist heat pack or a heating pad. °If you have a removable knee sleeve or brace, remove it as told by your health care provider. °Place a towel between your skin and the heat source. °Leave the heat on for  20-30 minutes. °Remove the heat if your skin turns bright red. This is especially important if you are unable to feel pain, heat, or cold. You may have a greater risk of getting burned. °If directed, put ice on the affected area. To do this: °If you have a removable knee sleeve or brace, remove it as told by your health care provider. °Put ice in a plastic bag. °Place a towel between your skin and the bag. °Leave the ice on for 20 minutes, 2-3 times a day. °Remove the ice if your skin turns bright red. This is very important. If you cannot feel pain, heat, or cold, you have a greater risk of damage to the area. °Move your toes often to reduce stiffness and swelling. °Raise (elevate) the injured area above the level of your heart while you are sitting or lying down. °Activity °Avoid high-impact activities or exercises, such as running, jumping rope, or doing jumping jacks. °Follow the exercise plan that your health care provider designed for you. Your health care provider may suggest that you: °Avoid activities that make knee pain worse. This may require you to change your exercise routines, sport participation, or job duties. °Wear shoes with cushioned soles. °Avoid sports that require running and sudden changes in direction. °Do physical therapy. Physical therapy is planned to match your needs and abilities. It may include exercises for strength, flexibility, stability, and endurance. °Do exercises that increase balance and strength, such as tai chi and yoga. °Do not use the injured limb to support your body weight   until your health care provider says that you can. Use crutches as told by your health care provider. °Return to your normal activities as told by your health care provider. Ask your health care provider what activities are safe for you. °General instructions °Take over-the-counter and prescription medicines only as told by your health care provider. °Lose weight if you are overweight. Losing even a  little weight can reduce knee pain. Ask your health care provider what your ideal weight is, and how to safely lose extra weight. A dietitian may be able to help you plan your meals. °Do not use any products that contain nicotine or tobacco, such as cigarettes, e-cigarettes, and chewing tobacco. These can delay healing. If you need help quitting, ask your health care provider. °Keep all follow-up visits. This is important. °Contact a health care provider if: °You have knee pain that is not getting better or gets worse. °You are unable to do your physical therapy exercises due to knee pain. °Get help right away if: °Your knee swells and the swelling becomes worse. °You cannot move your knee. °You have severe knee pain. °Summary °Knee pain that lasts more than 3 months is considered chronic knee pain. °The main treatments for chronic knee pain are physical therapy and weight loss. You may also need to take medicines, wear a knee sleeve or brace, use crutches, and apply ice or heat. °Losing even a little weight can reduce knee pain. Ask your health care provider what your ideal weight is, and how to safely lose extra weight. A dietitian may be able to help you plan your meals. °Follow the exercise plan that your health care provider designed for you. °This information is not intended to replace advice given to you by your health care provider. Make sure you discuss any questions you have with your health care provider. °Document Revised: 04/23/2020 Document Reviewed: 04/23/2020 °Elsevier Patient Education © 2022 [employer] ° °

## 2021-06-04 ENCOUNTER — Other Ambulatory Visit: Payer: Self-pay | Admitting: Family Medicine

## 2021-06-04 DIAGNOSIS — Z1231 Encounter for screening mammogram for malignant neoplasm of breast: Secondary | ICD-10-CM

## 2021-06-04 DIAGNOSIS — E782 Mixed hyperlipidemia: Secondary | ICD-10-CM

## 2021-06-04 LAB — CBC WITH DIFFERENTIAL/PLATELET
Basophils Absolute: 0.1 10*3/uL (ref 0.0–0.2)
Basos: 1 %
EOS (ABSOLUTE): 0.2 10*3/uL (ref 0.0–0.4)
Eos: 2 %
Hematocrit: 44.2 % (ref 34.0–46.6)
Hemoglobin: 15 g/dL (ref 11.1–15.9)
Immature Grans (Abs): 0 10*3/uL (ref 0.0–0.1)
Immature Granulocytes: 0 %
Lymphocytes Absolute: 4.2 10*3/uL — ABNORMAL HIGH (ref 0.7–3.1)
Lymphs: 36 %
MCH: 30.7 pg (ref 26.6–33.0)
MCHC: 33.9 g/dL (ref 31.5–35.7)
MCV: 90 fL (ref 79–97)
Monocytes Absolute: 0.6 10*3/uL (ref 0.1–0.9)
Monocytes: 5 %
Neutrophils Absolute: 6.4 10*3/uL (ref 1.4–7.0)
Neutrophils: 56 %
Platelets: 330 10*3/uL (ref 150–450)
RBC: 4.89 x10E6/uL (ref 3.77–5.28)
RDW: 13.1 % (ref 11.7–15.4)
WBC: 11.5 10*3/uL — ABNORMAL HIGH (ref 3.4–10.8)

## 2021-06-04 LAB — CMP14+EGFR
ALT: 10 IU/L (ref 0–32)
AST: 9 IU/L (ref 0–40)
Albumin/Globulin Ratio: 2.1 (ref 1.2–2.2)
Albumin: 4.5 g/dL (ref 3.8–4.8)
Alkaline Phosphatase: 74 IU/L (ref 44–121)
BUN/Creatinine Ratio: 13 (ref 9–23)
BUN: 12 mg/dL (ref 6–24)
Bilirubin Total: 0.3 mg/dL (ref 0.0–1.2)
CO2: 25 mmol/L (ref 20–29)
Calcium: 9.8 mg/dL (ref 8.7–10.2)
Chloride: 106 mmol/L (ref 96–106)
Creatinine, Ser: 0.89 mg/dL (ref 0.57–1.00)
Globulin, Total: 2.1 g/dL (ref 1.5–4.5)
Glucose: 92 mg/dL (ref 65–99)
Potassium: 4.9 mmol/L (ref 3.5–5.2)
Sodium: 144 mmol/L (ref 134–144)
Total Protein: 6.6 g/dL (ref 6.0–8.5)
eGFR: 80 mL/min/{1.73_m2} (ref >59)

## 2021-06-04 LAB — LIPID PANEL
Chol/HDL Ratio: 7.5 ratio — ABNORMAL HIGH (ref 0.0–4.4)
Cholesterol, Total: 317 mg/dL — ABNORMAL HIGH (ref 100–199)
HDL: 42 mg/dL (ref >39)
LDL Chol Calc (NIH): 244 mg/dL — ABNORMAL HIGH (ref 0–99)
Triglycerides: 162 mg/dL — ABNORMAL HIGH (ref 0–149)
VLDL Cholesterol Cal: 31 mg/dL (ref 5–40)

## 2021-06-04 MED ORDER — EZETIMIBE 10 MG PO TABS: 10.0000 mg | ORAL_TABLET | Freq: Every day | ORAL | 3 refills | Status: DC

## 2021-06-09 ENCOUNTER — Ambulatory Visit: Payer: Medicare HMO

## 2021-06-09 DIAGNOSIS — M170 Bilateral primary osteoarthritis of knee: Secondary | ICD-10-CM | POA: Diagnosis not present

## 2021-06-10 ENCOUNTER — Ambulatory Visit (INDEPENDENT_AMBULATORY_CARE_PROVIDER_SITE_OTHER): Payer: Medicare HMO

## 2021-06-10 VITALS — Ht 63.0 in | Wt 164.0 lb

## 2021-06-10 DIAGNOSIS — Z0000 Encounter for general adult medical examination without abnormal findings: Secondary | ICD-10-CM | POA: Diagnosis not present
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
## 2021-06-10 NOTE — Progress Notes (Signed)
I have reviewed the AWV documentation and agree with the written assessment and plan of care.  Marjorie Smolder, FNP-C [place]

## 2021-06-10 NOTE — Patient Instructions (Signed)
Ms. Emily Weeks , Thank you for taking time to come for your Medicare Wellness Visit. I appreciate your ongoing commitment to your health goals. Please review the following plan we discussed and let me know if I can assist you in the future.   Screening recommendations/referrals: Colonoscopy: Due at age 50 Mammogram: Keep appointment 06/17/2021 - Repeat annually  Bone Density: Due at age 64 Recommended yearly ophthalmology/optometry visit for glaucoma screening and checkup Recommended yearly dental visit for hygiene and checkup  Vaccinations: Influenza vaccine: Done 09/29/2020 - Repeat annually Pneumococcal vaccine: Due after age 58 Tdap vaccine: Done 07/05/2018 - Repeat in 10 years Shingles vaccine: Due after age 73  Covid-19: Done 02/21/2020 & 03/20/2020  Advanced directives: Please bring a copy of your health care power of attorney and living will to the office to be added to your chart at your convenience.   Conditions/risks identified: Aim for 30 minutes of exercise or brisk walking each day, drink 6-8 glasses of water and eat lots of fruits and vegetables.   Next appointment: Follow up in one year for your annual wellness visit.   Preventive Care 40-64 Years, Female Preventive care refers to lifestyle choices and visits with your health care provider that can promote health and wellness. What does preventive care include? A yearly physical exam. This is also called an annual well check. Dental exams once or twice a year. Routine eye exams. Ask your health care provider how often you should have your eyes checked. Personal lifestyle choices, including: Daily care of your teeth and gums. Regular physical activity. Eating a healthy diet. Avoiding tobacco and drug use. Limiting alcohol use. Practicing safe sex. Taking low-dose aspirin daily starting at age 13. Taking vitamin and mineral supplements as recommended by your health care provider. What happens during an annual well  check? The services and screenings done by your health care provider during your annual well check will depend on your age, overall health, lifestyle risk factors, and family history of disease. Counseling  Your health care provider may ask you questions about your: Alcohol use. Tobacco use. Drug use. Emotional well-being. Home and relationship well-being. Sexual activity. Eating habits. Work and work Statistician. Method of birth control. Menstrual cycle. Pregnancy history. Screening  You may have the following tests or measurements: Height, weight, and BMI. Blood pressure. Lipid and cholesterol levels. These may be checked every 5 years, or more frequently if you are over 51 years old. Skin check. Lung cancer screening. You may have this screening every year starting at age 46 if you have a 30-pack-year history of smoking and currently smoke or have quit within the past 15 years. Fecal occult blood test (FOBT) of the stool. You may have this test every year starting at age 25. Flexible sigmoidoscopy or colonoscopy. You may have a sigmoidoscopy every 5 years or a colonoscopy every 10 years starting at age 62. Hepatitis C blood test. Hepatitis B blood test. Sexually transmitted disease (STD) testing. Diabetes screening. This is done by checking your blood sugar (glucose) after you have not eaten for a while (fasting). You may have this done every 1-3 years. Mammogram. This may be done every 1-2 years. Talk to your health care provider about when you should start having regular mammograms. This may depend on whether you have a family history of breast cancer. BRCA-related cancer screening. This may be done if you have a family history of breast, ovarian, tubal, or peritoneal cancers. Pelvic exam and Pap test. This may be done  every 3 years starting at age 76. Starting at age 35, this may be done every 5 years if you have a Pap test in combination with an HPV test. Bone density scan. This  is done to screen for osteoporosis. You may have this scan if you are at high risk for osteoporosis. Discuss your test results, treatment options, and if necessary, the need for more tests with your health care provider. Vaccines  Your health care provider may recommend certain vaccines, such as: Influenza vaccine. This is recommended every year. Tetanus, diphtheria, and acellular pertussis (Tdap, Td) vaccine. You may need a Td booster every 10 years. Zoster vaccine. You may need this after age 46. Pneumococcal 13-valent conjugate (PCV13) vaccine. You may need this if you have certain conditions and were not previously vaccinated. Pneumococcal polysaccharide (PPSV23) vaccine. You may need one or two doses if you smoke cigarettes or if you have certain conditions. Talk to your health care provider about which screenings and vaccines you need and how often you need them. This information is not intended to replace advice given to you by your health care provider. Make sure you discuss any questions you have with your health care provider. Document Released: 12/05/2015 Document Revised: 07/28/2016 Document Reviewed: 09/09/2015 Elsevier Interactive Patient Education  2017 [place] Prevention in the Home Falls can cause injuries. They can happen to people of all ages. There are many things you can do to make your home safe and to help prevent falls. What can I do on the outside of my home? Regularly fix the edges of walkways and driveways and fix any cracks. Remove anything that might make you trip as you walk through a door, such as a raised step or threshold. Trim any bushes or trees on the path to your home. Use bright outdoor lighting. Clear any walking paths of anything that might make someone trip, such as rocks or tools. Regularly check to see if handrails are loose or broken. Make sure that both sides of any steps have handrails. Any raised decks and porches should have  guardrails on the edges. Have any leaves, snow, or ice cleared regularly. Use sand or salt on walking paths during winter. Clean up any spills in your garage right away. This includes oil or grease spills. What can I do in the bathroom? Use night lights. Install grab bars by the toilet and in the tub and shower. Do not use towel bars as grab bars. Use non-skid mats or decals in the tub or shower. If you need to sit down in the shower, use a plastic, non-slip stool. Keep the floor dry. Clean up any water that spills on the floor as soon as it happens. Remove soap buildup in the tub or shower regularly. Attach bath mats securely with double-sided non-slip rug tape. Do not have throw rugs and other things on the floor that can make you trip. What can I do in the bedroom? Use night lights. Make sure that you have a light by your bed that is easy to reach. Do not use any sheets or blankets that are too big for your bed. They should not hang down onto the floor. Have a firm chair that has side arms. You can use this for support while you get dressed. Do not have throw rugs and other things on the floor that can make you trip. What can I do in the kitchen? Clean up any spills right away. Avoid walking on  wet floors. Keep items that you use a lot in easy-to-reach places. If you need to reach something above you, use a strong step stool that has a grab bar. Keep electrical cords out of the way. Do not use floor polish or wax that makes floors slippery. If you must use wax, use non-skid floor wax. Do not have throw rugs and other things on the floor that can make you trip. What can I do with my stairs? Do not leave any items on the stairs. Make sure that there are handrails on both sides of the stairs and use them. Fix handrails that are broken or loose. Make sure that handrails are as long as the stairways. Check any carpeting to make sure that it is firmly attached to the stairs. Fix any carpet  that is loose or worn. Avoid having throw rugs at the top or bottom of the stairs. If you do have throw rugs, attach them to the floor with carpet tape. Make sure that you have a light switch at the top of the stairs and the bottom of the stairs. If you do not have them, ask someone to add them for you. What else can I do to help prevent falls? Wear shoes that: Do not have high heels. Have rubber bottoms. Are comfortable and fit you well. Are closed at the toe. Do not wear sandals. If you use a stepladder: Make sure that it is fully opened. Do not climb a closed stepladder. Make sure that both sides of the stepladder are locked into place. Ask someone to hold it for you, if possible. Clearly mark and make sure that you can see: Any grab bars or handrails. First and last steps. Where the edge of each step is. Use tools that help you move around (mobility aids) if they are needed. These include: Canes. Walkers. Scooters. Crutches. Turn on the lights when you go into a dark area. Replace any light bulbs as soon as they burn out. Set up your furniture so you have a clear path. Avoid moving your furniture around. If any of your floors are uneven, fix them. If there are any pets around you, be aware of where they are. Review your medicines with your doctor. Some medicines can make you feel dizzy. This can increase your chance of falling. Ask your doctor what other things that you can do to help prevent falls. This information is not intended to replace advice given to you by your health care provider. Make sure you discuss any questions you have with your health care provider. Document Released: 09/04/2009 Document Revised: 04/15/2016 Document Reviewed: 12/13/2014 Elsevier Interactive Patient Education  2017 [employer].

## 2021-06-17 ENCOUNTER — Ambulatory Visit
Admission: RE | Admit: 2021-06-17 | Discharge: 2021-06-17 | Disposition: A | Discharge status: Home / Self Care | Payer: Medicare HMO | Source: Ambulatory Visit | Attending: Family Medicine | Admitting: Family Medicine

## 2021-06-17 ENCOUNTER — Other Ambulatory Visit: Payer: Self-pay

## 2021-06-17 DIAGNOSIS — Z1231 Encounter for screening mammogram for malignant neoplasm of breast: Secondary | ICD-10-CM | POA: Diagnosis not present

## 2021-06-29 DIAGNOSIS — M170 Bilateral primary osteoarthritis of knee: Secondary | ICD-10-CM | POA: Diagnosis not present

## 2021-07-06 DIAGNOSIS — M170 Bilateral primary osteoarthritis of knee: Secondary | ICD-10-CM | POA: Diagnosis not present

## 2021-07-13 DIAGNOSIS — M170 Bilateral primary osteoarthritis of knee: Secondary | ICD-10-CM | POA: Diagnosis not present

## 2021-07-15 ENCOUNTER — Ambulatory Visit: Payer: Medicare HMO | Admitting: Family Medicine

## 2021-07-16 ENCOUNTER — Encounter: Payer: Self-pay | Admitting: Family Medicine

## 2021-07-21 ENCOUNTER — Encounter: Payer: Self-pay | Admitting: Family Medicine

## 2021-07-21 ENCOUNTER — Other Ambulatory Visit: Payer: Self-pay

## 2021-07-21 ENCOUNTER — Ambulatory Visit (INDEPENDENT_AMBULATORY_CARE_PROVIDER_SITE_OTHER): Payer: Medicare HMO | Admitting: Family Medicine

## 2021-07-21 VITALS — BP 122/84 | HR 70 | Temp 98.3°F | Ht 63.0 in | Wt 168.0 lb

## 2021-07-21 DIAGNOSIS — F3340 Major depressive disorder, recurrent, in remission, unspecified: Secondary | ICD-10-CM

## 2021-07-21 DIAGNOSIS — F411 Generalized anxiety disorder: Secondary | ICD-10-CM | POA: Diagnosis not present

## 2021-07-21 NOTE — Patient Instructions (Signed)
Managing Anxiety, Adult After being diagnosed with an anxiety disorder, you may be relieved to know why you have felt or behaved a certain way. You may also feel overwhelmed about the treatment ahead and what it will mean for your life. With care and support, youcan manage this condition and recover from it. How to manage lifestyle changes Managing stress and anxiety  Stress is your body's reaction to life changes and events, both good and bad. Most stress will last just a few hours, but stress can be ongoing and can lead to more than just stress. Although stress can play a major role in anxiety, it is not the same as anxiety. Stress is usually caused by something external, such as a deadline, test, or competition. Stress normally passes after thetriggering event has ended.  Anxiety is caused by something internal, such as imagining a terrible outcome or worrying that something will go wrong that will devastate you. Anxiety often does not go away even after the triggering event is over, and it can become long-term (chronic) worry. It is important to understand the differences between stress and anxiety and to manage your stress effectively so that it does not lead to ananxious response. Talk with your health care provider or a counselor to learn more about reducing anxiety and stress. He or she may suggest tension reduction techniques, such as: Music therapy. This can include creating or listening to music that you enjoy and that inspires you. Mindfulness-based meditation. This involves being aware of your normal breaths while not trying to control your breathing. It can be done while sitting or walking. Centering prayer. This involves focusing on a word, phrase, or sacred image that means something to you and brings you peace. Deep breathing. To do this, expand your stomach and inhale slowly through your nose. Hold your breath for 3-5 seconds. Then exhale slowly, letting your stomach muscles  relax. Self-talk. This involves identifying thought patterns that lead to anxiety reactions and changing those patterns. Muscle relaxation. This involves tensing muscles and then relaxing them. Choose a tension reduction technique that suits your lifestyle and personality. These techniques take time and practice. Set aside 5-15 minutes a day to do them. Therapists can offer counseling and training in these techniques. The training to help with anxiety may be covered by some insurance plans. Other things you can do to manage stress and anxiety include: Keeping a stress/anxiety diary. This can help you learn what triggers your reaction and then learn ways to manage your response. Thinking about how you react to certain situations. You may not be able to control everything, but you can control your response. Making time for activities that help you relax and not feeling guilty about spending your time in this way. Visual imagery and yoga can help you stay calm and relax.  Medicines Medicines can help ease symptoms. Medicines for anxiety include: Anti-anxiety drugs. Antidepressants. Medicines are often used as a primary treatment for anxiety disorder. Medicines will be prescribed by a health care provider. When used together, medicines, psychotherapy, and tension reduction techniques may be the most effectivetreatment. Relationships Relationships can play a big part in helping you recover. Try to spend more time connecting with trusted friends and family members. Consider going to couples counseling, taking family education classes, or going to familytherapy. Therapy can help you and others better understand your condition. How to recognize changes in your anxiety Everyone responds differently to treatment for anxiety. Recovery from anxiety happens when symptoms decrease and stop  interfering with your daily activities at home or work. This may mean that you will start to: Have better concentration and  focus. Worry will interfere less in your daily thinking. Sleep better. Be less irritable. Have more energy. Have improved memory. It is important to recognize when your condition is getting worse. Contact your health care provider if your symptoms interfere with home or work and you feellike your condition is not improving. Follow these instructions at home: Activity Exercise. Most adults should do the following: Exercise for at least 150 minutes each week. The exercise should increase your heart rate and make you sweat (moderate-intensity exercise). Strengthening exercises at least twice a week. Get the right amount and quality of sleep. Most adults need 7-9 hours of sleep each night. Lifestyle  Eat a healthy diet that includes plenty of vegetables, fruits, whole grains, low-fat dairy products, and lean protein. Do not eat a lot of foods that are high in solid fats, added sugars, or salt. Make choices that simplify your life. Do not use any products that contain nicotine or tobacco, such as cigarettes, e-cigarettes, and chewing tobacco. If you need help quitting, ask your health care provider. Avoid caffeine, alcohol, and certain over-the-counter cold medicines. These may make you feel worse. Ask your pharmacist which medicines to avoid.  General instructions Take over-the-counter and prescription medicines only as told by your health care provider. Keep all follow-up visits as told by your health care provider. This is important. Where to find support You can get help and support from these sources: Self-help groups. Online and [employer]. A trusted spiritual leader. Couples counseling. Family education classes. Family therapy. Where to find more information You may find that joining a support group helps you deal with your anxiety. The following sources can help you locate counselors or support groups near you: [place]:  www.mentalhealthamerica.net Anxiety and Depression Association of [place] (ADAA): https://www.clark.net/ National Alliance on Mental Illness (NAMI): www.nami.org Contact a health care provider if you: Have a hard time staying focused or finishing daily tasks. Spend many hours a day feeling worried about everyday life. Become exhausted by worry. Start to have headaches, feel tense, or have nausea. Urinate more than normal. Have diarrhea. Get help right away if you have: A racing heart and shortness of breath. Thoughts of hurting yourself or others. If you ever feel like you may hurt yourself or others, or have thoughts about taking your own life, get help right away. You can go to your nearest emergency department or call: Your local emergency services (911 in the [place] A suicide crisis helpline, such as the [place] at 719-691-3133. This is open 24 hours a day. Summary Taking steps to learn and use tension reduction techniques can help calm you and help prevent triggering an anxiety reaction. When used together, medicines, psychotherapy, and tension reduction techniques may be the most effective treatment. Family, friends, and partners can play a big part in helping you recover from an anxiety disorder. This information is not intended to replace advice given to you by your health care provider. Make sure you discuss any questions you have with your healthcare provider. Document Revised: 04/10/2019 Document Reviewed: 04/10/2019 Elsevier Patient Education  [place].
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
## 2021-08-12 ENCOUNTER — Ambulatory Visit: Payer: Medicare HMO | Admitting: Family Medicine

## 2021-08-24 ENCOUNTER — Encounter: Payer: Self-pay | Admitting: Family Medicine

## 2021-08-25 ENCOUNTER — Ambulatory Visit (INDEPENDENT_AMBULATORY_CARE_PROVIDER_SITE_OTHER): Payer: Medicare HMO | Admitting: Family Medicine

## 2021-08-25 ENCOUNTER — Encounter: Payer: Self-pay | Admitting: Family Medicine

## 2021-08-25 DIAGNOSIS — J0140 Acute pansinusitis, unspecified: Secondary | ICD-10-CM

## 2021-08-25 DIAGNOSIS — H902 Conductive hearing loss, unspecified: Secondary | ICD-10-CM | POA: Diagnosis not present

## 2021-08-25 DIAGNOSIS — H9201 Otalgia, right ear: Secondary | ICD-10-CM

## 2021-08-25 MED ORDER — AMOXICILLIN-POT CLAVULANATE 875-125 MG PO TABS: 1.0000 | ORAL_TABLET | Freq: Two times a day (BID) | ORAL | 0 refills | Status: AC
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
## 2021-09-02 ENCOUNTER — Encounter: Payer: Self-pay | Admitting: Family Medicine

## 2021-09-07 ENCOUNTER — Ambulatory Visit (INDEPENDENT_AMBULATORY_CARE_PROVIDER_SITE_OTHER): Payer: Medicare HMO | Admitting: Nurse Practitioner

## 2021-09-07 ENCOUNTER — Encounter: Payer: Self-pay | Admitting: Nurse Practitioner

## 2021-09-07 DIAGNOSIS — J0101 Acute recurrent maxillary sinusitis: Secondary | ICD-10-CM

## 2021-09-07 MED ORDER — LEVOFLOXACIN 500 MG PO TABS: 500.0000 mg | ORAL_TABLET | Freq: Every day | ORAL | 0 refills | Status: DC

## 2021-09-07 MED ORDER — FLUTICASONE PROPIONATE 50 MCG/ACT NA SUSP
2.0000 | Freq: Every day | NASAL | 6 refills | Status: DC
Start: 2021-09-07 — End: 2022-10-04

## 2021-09-07 MED ORDER — CHLORPHEN-PE-ACETAMINOPHEN 4-10-325 MG PO TABS: 1.0000 | ORAL_TABLET | Freq: Four times a day (QID) | ORAL | 0 refills | Status: DC | PRN

## 2021-09-07 NOTE — Progress Notes (Signed)
Virtual Visit  Note Due to COVID-19 pandemic this visit was conducted virtually. This visit type was conducted due to national recommendations for restrictions regarding the COVID-19 Pandemic (e.g. social distancing, sheltering in place) in an effort to limit this patient's exposure and mitigate transmission in our community. All issues noted in this document were discussed and addressed.  A physical exam was not performed with this format.  I connected with Emily Weeks on 09/07/21 at 2:33 by telephone and verified that I am speaking with the correct person using two identifiers. Emily Weeks is currently located at home and no one is currently with her during visit. The provider, Mary-Margaret Hassell Done, FNP is located in their office at time of visit.  I discussed the limitations, risks, security and privacy concerns of performing an evaluation and management service by telephone and the availability of in person appointments. I also discussed with the patient that there may be a patient responsible charge related to this service. The patient expressed understanding and agreed to proceed.   History and Present Illness:   Chief Complaint: sinusitis  HPI Patient calls in c/o nasal congestion and facial pressure. She was just on augmentin 2 weeks ago with same complaint. Got some better then started back again.home covid test was negative. Has ENT appointment in 2 weeks  Review of Systems  Constitutional:  Positive for malaise/fatigue. Negative for chills and fever.  HENT:  Positive for congestion and sinus pain. Negative for sore throat.   Respiratory:  Positive for cough. Negative for sputum production and shortness of breath.   Musculoskeletal:  Negative for myalgias.  Neurological:  Positive for dizziness and headaches.    Observations/Objective: Alert and oriented- answers all questions appropriately No distress Dry cough Voice hoarse  Assessment and Plan: Emily Weeks in  today with chief complaint of Sinusitis   1. Acute recurrent maxillary sinusitis 1. Take meds as prescribed 2. Use a cool mist humidifier especially during the winter months and when heat has been humid. 3. Use saline nose sprays frequently 4. Saline irrigations of the nose can be very helpful if done frequently.  * 4X daily for 1 week*  * Use of a nettie pot can be helpful with this. Follow directions with this* 5. Drink plenty of fluids 6. Keep thermostat turn down low 7.For any cough or congestion  Use plain Mucinex- regular strength or max strength is fine   * Children- consult with Pharmacist for dosing 8. For fever or aces or pains- take tylenol or ibuprofen appropriate for age and weight.  * for fevers greater than 101 orally you may alternate ibuprofen and tylenol every  3 hours.    - levofloxacin (LEVAQUIN) 500 MG tablet; Take 1 tablet (500 mg total) by mouth daily.  Dispense: 7 tablet; Refill: 0 - Chlorphen-PE-Acetaminophen 4-10-325 MG TABS; Take 1 tablet by mouth every 6 (six) hours as needed.  Dispense: 20 tablet; Refill: 0 - fluticasone (FLONASE) 50 MCG/ACT nasal spray; Place 2 sprays into both nostrils daily.  Dispense: 16 g; Refill: 6  Keep ENT appointment  Follow Up Instruction: prn    I discussed the assessment and treatment plan with the patient. The patient was provided an opportunity to ask questions and all were answered. The patient agreed with the plan and demonstrated an understanding of the instructions.   The patient was advised to call back or seek an in-person evaluation if the symptoms worsen or if the condition fails to improve as anticipated.  The above  assessment and management plan was discussed with the patient. The patient verbalized understanding of and has agreed to the management plan. Patient is aware to call the clinic if symptoms persist or worsen. Patient is aware when to return to the clinic for a follow-up visit. Patient educated on when it  is appropriate to go to the emergency department.   Time call ended:  2:45  I provided 12 minutes of  non face-to-face time during this encounter.    Mary-Margaret Hassell Done, FNP

## 2021-09-22 DIAGNOSIS — Z9889 Other specified postprocedural states: Secondary | ICD-10-CM | POA: Diagnosis not present

## 2021-09-22 DIAGNOSIS — H6592 Unspecified nonsuppurative otitis media, left ear: Secondary | ICD-10-CM | POA: Diagnosis not present

## 2021-10-21 ENCOUNTER — Ambulatory Visit: Payer: Medicare HMO | Admitting: Family Medicine

## 2021-11-10 DIAGNOSIS — H6981 Other specified disorders of Eustachian tube, right ear: Secondary | ICD-10-CM | POA: Diagnosis not present

## 2021-11-10 DIAGNOSIS — H9071 Mixed conductive and sensorineural hearing loss, unilateral, right ear, with unrestricted hearing on the contralateral side: Secondary | ICD-10-CM | POA: Diagnosis not present

## 2021-11-10 DIAGNOSIS — Z9889 Other specified postprocedural states: Secondary | ICD-10-CM | POA: Diagnosis not present

## 2021-11-10 DIAGNOSIS — H902 Conductive hearing loss, unspecified: Secondary | ICD-10-CM | POA: Diagnosis not present

## 2022-02-25 ENCOUNTER — Ambulatory Visit (INDEPENDENT_AMBULATORY_CARE_PROVIDER_SITE_OTHER): Payer: Medicare HMO | Admitting: Family Medicine

## 2022-02-25 ENCOUNTER — Encounter: Payer: Self-pay | Admitting: Family Medicine

## 2022-02-25 VITALS — BP 121/79 | HR 83 | Temp 98.0°F | Ht 63.0 in | Wt 175.1 lb

## 2022-02-25 DIAGNOSIS — F411 Generalized anxiety disorder: Secondary | ICD-10-CM

## 2022-02-25 DIAGNOSIS — F339 Major depressive disorder, recurrent, unspecified: Secondary | ICD-10-CM

## 2022-02-25 DIAGNOSIS — E669 Obesity, unspecified: Secondary | ICD-10-CM | POA: Diagnosis not present

## 2022-02-25 DIAGNOSIS — Z0000 Encounter for general adult medical examination without abnormal findings: Secondary | ICD-10-CM

## 2022-02-25 DIAGNOSIS — Z0001 Encounter for general adult medical examination with abnormal findings: Secondary | ICD-10-CM | POA: Diagnosis not present

## 2022-02-25 DIAGNOSIS — L609 Nail disorder, unspecified: Secondary | ICD-10-CM | POA: Diagnosis not present

## 2022-02-25 DIAGNOSIS — Z1211 Encounter for screening for malignant neoplasm of colon: Secondary | ICD-10-CM

## 2022-02-25 DIAGNOSIS — Z720 Tobacco use: Secondary | ICD-10-CM | POA: Diagnosis not present

## 2022-02-25 DIAGNOSIS — E782 Mixed hyperlipidemia: Secondary | ICD-10-CM

## 2022-02-25 MED ORDER — EZETIMIBE 10 MG PO TABS: 10.0000 mg | ORAL_TABLET | Freq: Every day | ORAL | 3 refills | Status: DC

## 2022-02-25 MED ORDER — ESCITALOPRAM OXALATE 10 MG PO TABS: 10.0000 mg | ORAL_TABLET | Freq: Every day | ORAL | 3 refills | Status: DC

## 2022-02-25 NOTE — Patient Instructions (Addendum)
Health Maintenance, Female ?Adopting a healthy lifestyle and getting preventive care are important in promoting health and wellness. Ask your health care provider about: ?The right schedule for you to have regular tests and exams. ?Things you can do on your own to prevent diseases and keep yourself healthy. ?What should I know about diet, weight, and exercise? ?Eat a healthy diet ? ?Eat a diet that includes plenty of vegetables, fruits, low-fat dairy products, and lean protein. ?Do not eat a lot of foods that are high in solid fats, added sugars, or sodium. ?Maintain a healthy weight ?Body mass index (BMI) is used to identify weight problems. It estimates body fat based on height and weight. Your health care provider can help determine your BMI and help you achieve or maintain a healthy weight. ?Get regular exercise ?Get regular exercise. This is one of the most important things you can do for your health. Most adults should: ?Exercise for at least 150 minutes each week. The exercise should increase your heart rate and make you sweat (moderate-intensity exercise). ?Do strengthening exercises at least twice a week. This is in addition to the moderate-intensity exercise. ?Spend less time sitting. Even light physical activity can be beneficial. ?Watch cholesterol and blood lipids ?Have your blood tested for lipids and cholesterol at 50 years of age, then have this test every 5 years. ?Have your cholesterol levels checked more often if: ?Your lipid or cholesterol levels are high. ?You are older than 51 years of age. ?You are at high risk for heart disease. ?What should I know about cancer screening? ?Depending on your health history and family history, you may need to have cancer screening at various ages. This may include screening for: ?Breast cancer. ?Cervical cancer. ?Colorectal cancer. ?Skin cancer. ?Lung cancer. ?What should I know about heart disease, diabetes, and high blood pressure? ?Blood pressure and heart  disease ?High blood pressure causes heart disease and increases the risk of stroke. This is more likely to develop in people who have high blood pressure readings or are overweight. ?Have your blood pressure checked: ?Every 3-5 years if you are 6-48 years of age. ?Every year if you are 15 years old or older. ?Diabetes ?Have regular diabetes screenings. This checks your fasting blood sugar level. Have the screening done: ?Once every three years after age 27 if you are at a normal weight and have a low risk for diabetes. ?More often and at a younger age if you are overweight or have a high risk for diabetes. ?What should I know about preventing infection? ?Hepatitis B ?If you have a higher risk for hepatitis B, you should be screened for this virus. Talk with your health care provider to find out if you are at risk for hepatitis B infection. ?Hepatitis C ?Testing is recommended for: ?Everyone born from [birth year] through 1964. ?Anyone with known risk factors for hepatitis C. ?Sexually transmitted infections (STIs) ?Get screened for STIs, including gonorrhea and chlamydia, if: ?You are sexually active and are younger than 50 years of age. ?You are older than 50 years of age and your health care provider tells you that you are at risk for this type of infection. ?Your sexual activity has changed since you were last screened, and you are at increased risk for chlamydia or gonorrhea. Ask your health care provider if you are at risk. ?Ask your health care provider about whether you are at high risk for HIV. Your health care provider may recommend a prescription medicine to help prevent HIV  infection. If you choose to take medicine to prevent HIV, you should first get tested for HIV. You should then be tested every 3 months for as long as you are taking the medicine. ?Pregnancy ?If you are about to stop having your period (premenopausal) and you may become pregnant, seek counseling before you get pregnant. ?Take 400 to 800  micrograms (mcg) of folic acid every day if you become pregnant. ?Ask for birth control (contraception) if you want to prevent pregnancy. ?Osteoporosis and menopause ?Osteoporosis is a disease in which the bones lose minerals and strength with aging. This can result in bone fractures. If you are 29 years old or older, or if you are at risk for osteoporosis and fractures, ask your health care provider if you should: ?Be screened for bone loss. ?Take a calcium or vitamin D supplement to lower your risk of fractures. ?Be given hormone replacement therapy (HRT) to treat symptoms of menopause. ?Follow these instructions at home: ?Alcohol use ?Do not drink alcohol if: ?Your health care provider tells you not to drink. ?You are pregnant, may be pregnant, or are planning to become pregnant. ?If you drink alcohol: ?Limit how much you have to: ?0-1 drink a day. ?Know how much alcohol is in your drink. In the [place] one drink equals one 12 oz bottle of beer (355 mL), one 5 oz glass of wine (148 mL), or one 1? oz glass of hard liquor (44 mL). ?Lifestyle ?Do not use any products that contain nicotine or tobacco. These products include cigarettes, chewing tobacco, and vaping devices, such as e-cigarettes. If you need help quitting, ask your health care provider. ?Do not use street drugs. ?Do not share needles. ?Ask your health care provider for help if you need support or information about quitting drugs. ?General instructions ?Schedule regular health, dental, and eye exams. ?Stay current with your vaccines. ?Tell your health care provider if: ?You often feel depressed. ?You have ever been abused or do not feel safe at home. ?Summary ?Adopting a healthy lifestyle and getting preventive care are important in promoting health and wellness. ?Follow your health care provider's instructions about healthy diet, exercising, and getting tested or screened for diseases. ?Follow your health care provider's instructions on monitoring your  cholesterol and blood pressure. ?This information is not intended to replace advice given to you by your health care provider. Make sure you discuss any questions you have with your health care provider. ?Document Revised: 03/30/2021 Document Reviewed: 03/30/2021 ?Elsevier Patient Education ? [place]. ?Smoking Tobacco Information, Adult ?Smoking tobacco can be harmful to your health. Tobacco contains a poisonous (toxic), colorless chemical called nicotine. Nicotine is addictive. It changes the brain and can make it hard to stop smoking. Tobacco also has other toxic chemicals that can hurt your body and raise your risk of many cancers. ?How can smoking tobacco affect me? ?Smoking tobacco puts you at risk for: ?Cancer. Smoking is most commonly associated with lung cancer, but can also lead to cancer in other parts of the body. ?Chronic obstructive pulmonary disease (COPD). This is a long-term lung condition that makes it hard to breathe. It also gets worse over time. ?High blood pressure (hypertension), heart disease, stroke, or heart attack. ?Lung infections, such as pneumonia. ?Cataracts. This is when the lenses in the eyes become clouded. ?Digestive problems. This may include peptic ulcers, heartburn, and gastroesophageal reflux disease (GERD). ?Oral health problems, such as gum disease and tooth loss. ?Loss of taste and smell. ?Smoking can affect your  appearance by causing: ?Wrinkles. ?Yellow or stained teeth, fingers, and fingernails. ?Smoking tobacco can also affect your social life, because: ?It may be challenging to find places to smoke when away from home. Many workplaces, [employer], hotels, and public places are tobacco-free. ?Smoking is expensive. This is due to the cost of tobacco and the long-term costs of treating health problems from smoking. ?Secondhand smoke may affect those around you. Secondhand smoke can cause lung cancer, breathing problems, and heart disease. Children of smokers have  a higher risk for: ?Sudden infant death syndrome (SIDS). ?Ear infections. ?Lung infections. ?If you currently smoke tobacco, quitting now can help you: ?Lead a longer and healthier life. ?Look, smell, breathe
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
## 2022-02-26 LAB — CBC WITH DIFFERENTIAL/PLATELET
Basophils Absolute: 0.1 10*3/uL (ref 0.0–0.2)
Basos: 1 %
EOS (ABSOLUTE): 0.1 10*3/uL (ref 0.0–0.4)
Eos: 1 %
Hematocrit: 41.9 % (ref 34.0–46.6)
Hemoglobin: 14.6 g/dL (ref 11.1–15.9)
Immature Grans (Abs): 0 10*3/uL (ref 0.0–0.1)
Immature Granulocytes: 0 %
Lymphocytes Absolute: 4.2 10*3/uL — ABNORMAL HIGH (ref 0.7–3.1)
Lymphs: 44 %
MCH: 30.7 pg (ref 26.6–33.0)
MCHC: 34.8 g/dL (ref 31.5–35.7)
MCV: 88 fL (ref 79–97)
Monocytes Absolute: 0.5 10*3/uL (ref 0.1–0.9)
Monocytes: 5 %
Neutrophils Absolute: 4.5 10*3/uL (ref 1.4–7.0)
Neutrophils: 49 %
Platelets: 340 10*3/uL (ref 150–450)
RBC: 4.76 x10E6/uL (ref 3.77–5.28)
RDW: 12.4 % (ref 11.7–15.4)
WBC: 9.4 10*3/uL (ref 3.4–10.8)

## 2022-02-26 LAB — CMP14+EGFR
ALT: 13 IU/L (ref 0–32)
AST: 16 IU/L (ref 0–40)
Albumin/Globulin Ratio: 2.5 — ABNORMAL HIGH (ref 1.2–2.2)
Albumin: 4.7 g/dL (ref 3.8–4.8)
Alkaline Phosphatase: 76 IU/L (ref 44–121)
BUN/Creatinine Ratio: 14 (ref 9–23)
BUN: 11 mg/dL (ref 6–24)
Bilirubin Total: 0.3 mg/dL (ref 0.0–1.2)
CO2: 24 mmol/L (ref 20–29)
Calcium: 9.5 mg/dL (ref 8.7–10.2)
Chloride: 106 mmol/L (ref 96–106)
Creatinine, Ser: 0.8 mg/dL (ref 0.57–1.00)
Globulin, Total: 1.9 g/dL (ref 1.5–4.5)
Glucose: 87 mg/dL (ref 70–99)
Potassium: 4.6 mmol/L (ref 3.5–5.2)
Sodium: 143 mmol/L (ref 134–144)
Total Protein: 6.6 g/dL (ref 6.0–8.5)
eGFR: 90 mL/min/{1.73_m2} (ref >59)

## 2022-02-26 LAB — LIPID PANEL
Chol/HDL Ratio: 6.9 ratio — ABNORMAL HIGH (ref 0.0–4.4)
Cholesterol, Total: 303 mg/dL — ABNORMAL HIGH (ref 100–199)
HDL: 44 mg/dL (ref >39)
LDL Chol Calc (NIH): 226 mg/dL — ABNORMAL HIGH (ref 0–99)
Triglycerides: 169 mg/dL — ABNORMAL HIGH (ref 0–149)
VLDL Cholesterol Cal: 33 mg/dL (ref 5–40)

## 2022-02-26 LAB — TSH: TSH: 1.33 u[IU]/mL (ref 0.450–4.500)

## 2022-03-02 ENCOUNTER — Other Ambulatory Visit: Payer: Self-pay | Admitting: Family Medicine

## 2022-03-02 DIAGNOSIS — E782 Mixed hyperlipidemia: Secondary | ICD-10-CM

## 2022-03-02 MED ORDER — ATORVASTATIN CALCIUM 40 MG PO TABS: 40.0000 mg | ORAL_TABLET | Freq: Every day | ORAL | 1 refills | Status: DC

## 2022-03-04 ENCOUNTER — Encounter: Payer: Self-pay | Admitting: Internal Medicine

## 2022-03-25 DIAGNOSIS — M79674 Pain in right toe(s): Secondary | ICD-10-CM | POA: Diagnosis not present

## 2022-03-25 DIAGNOSIS — L03031 Cellulitis of right toe: Secondary | ICD-10-CM | POA: Diagnosis not present
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
## 2022-03-31 ENCOUNTER — Encounter: Payer: Self-pay | Admitting: Gastroenterology

## 2022-03-31 ENCOUNTER — Ambulatory Visit (INDEPENDENT_AMBULATORY_CARE_PROVIDER_SITE_OTHER): Payer: Medicare HMO | Admitting: Gastroenterology

## 2022-03-31 VITALS — BP 126/76 | HR 90 | Temp 97.5°F | Ht 63.0 in | Wt 179.4 lb

## 2022-03-31 DIAGNOSIS — Z1211 Encounter for screening for malignant neoplasm of colon: Secondary | ICD-10-CM | POA: Diagnosis not present

## 2022-03-31 DIAGNOSIS — R12 Heartburn: Secondary | ICD-10-CM | POA: Diagnosis not present

## 2022-03-31 DIAGNOSIS — R1011 Right upper quadrant pain: Secondary | ICD-10-CM | POA: Insufficient documentation

## 2022-03-31 DIAGNOSIS — R1010 Upper abdominal pain, unspecified: Secondary | ICD-10-CM | POA: Diagnosis not present

## 2022-03-31 NOTE — Patient Instructions (Signed)
We will arrange for you to have a colonoscopy in the near future with Dr. Gala Romney. ? ?Please start Protonix 40 mg daily 30 minutes before breakfast to control your heartburn symptoms. ? ?Follow a GERD diet:  ?Avoid fried, fatty, greasy, spicy, citrus foods. ?Avoid caffeine and carbonated beverages. ?Avoid chocolate. ?Try eating 4-6 small meals a day rather than 3 large meals. ?Do not eat within 3 hours of laying down. ?Prop head of bed up on wood or bricks to create a 6 inch incline. ? ?Avoid overeating as this is the primary cause of your occasional right-sided abdominal pain. ? ?We will plan to see you back in the office in about 3 months.  Do not hesitate to call if you have any questions or concerns prior to your next visit. ? ?It was great meeting you today! ? ?Aliene Altes, PA-C ?[facility] ? ?

## 2022-04-02 ENCOUNTER — Telehealth: Payer: Self-pay | Admitting: *Deleted

## 2022-04-02 ENCOUNTER — Encounter: Payer: Self-pay | Admitting: *Deleted

## 2022-04-02 MED ORDER — PEG 3350-KCL-NA BICARB-NACL 420 G PO SOLR: ORAL | 0 refills | Status: DC

## 2022-04-02 NOTE — Telephone Encounter (Signed)
Called pt. Scheduled for TCS with Dr. Gala Romney asa 3 on 6/16 at 9:30am. Aware will mail prep instructions and pre-op appt. Will send rx to [facility] per pt request. ? ? ?PA approved via cohere. Auth# [id number], DOS: 05/07/2022 - 08/05/2022 ?

## 2022-04-30 NOTE — Patient Instructions (Signed)
Emily Weeks  04/30/2022     @PREFPERIOPPHARMACY @   Your procedure is scheduled on  05/07/2022.   Report to [facility] at  0800  A.M.   Call this number if you have problems the morning of surgery:  [phone]   Remember:  Follow the diet and prep instructions given to you by the office.    Take these medicines the morning of surgery with A SIP OF WATER                                     lexapro.    Do not wear jewelry, make-up or nail polish.  Do not wear lotions, powders, or perfumes, or deodorant.  Do not shave 48 hours prior to surgery.  Men may shave face and neck.  Do not bring valuables to the hospital.  [facility] is not responsible for any belongings or valuables.  Contacts, dentures or bridgework may not be worn into surgery.  Leave your suitcase in the car.  After surgery it may be brought to your room.  For patients admitted to the hospital, discharge time will be determined by your treatment team.  Patients discharged the day of surgery will not be allowed to drive home and must have someone with them for 24 hours.    Special instructions:   DO NOT smoke tobacco or vape for 24 hours before your procedure.  Please read over the following fact sheets that you were given. Anesthesia Post-op Instructions and Care and Recovery After Surgery      Colonoscopy, Adult, Care After The following information offers guidance on how to care for yourself after your procedure. Your health care provider may also give you more specific instructions. If you have problems or questions, contact your health care provider. What can I expect after the procedure? After the procedure, it is common to have: A small amount of blood in your stool for 24 hours after the procedure. Some gas. Mild cramping or bloating of your abdomen. Follow these instructions at home: Eating and drinking  Drink enough fluid to keep your urine pale yellow. Follow instructions from your  health care provider about eating or drinking restrictions. Resume your normal diet as told by your health care provider. Avoid heavy or fried foods that are hard to digest. Activity Rest as told by your health care provider. Avoid sitting for a long time without moving. Get up to take short walks every 1-2 hours. This is important to improve blood flow and breathing. Ask for help if you feel weak or unsteady. Return to your normal activities as told by your health care provider. Ask your health care provider what activities are safe for you. Managing cramping and bloating  Try walking around when you have cramps or feel bloated. If directed, apply heat to your abdomen as told by your health care provider. Use the heat source that your health care provider recommends, such as a moist heat pack or a heating pad. Place a towel between your skin and the heat source. Leave the heat on for 20-30 minutes. Remove the heat if your skin turns bright red. This is especially important if you are unable to feel pain, heat, or cold. You have a greater risk of getting burned. General instructions If you were given a sedative during the procedure, it can affect you for several hours. Do not  drive or operate machinery until your health care provider says that it is safe. For the first 24 hours after the procedure: Do not sign important documents. Do not drink alcohol. Do your regular daily activities at a slower pace than normal. Eat soft foods that are easy to digest. Take over-the-counter and prescription medicines only as told by your health care provider. Keep all follow-up visits. This is important. Contact a health care provider if: You have blood in your stool 2-3 days after the procedure. Get help right away if: You have more than a small spotting of blood in your stool. You have large blood clots in your stool. You have swelling of your abdomen. You have nausea or vomiting. You have a  fever. You have increasing pain in your abdomen that is not relieved with medicine. These symptoms may be an emergency. Get help right away. Call 911. Do not wait to see if the symptoms will go away. Do not drive yourself to the hospital. Summary After the procedure, it is common to have a small amount of blood in your stool. You may also have mild cramping and bloating of your abdomen. If you were given a sedative during the procedure, it can affect you for several hours. Do not drive or operate machinery until your health care provider says that it is safe. Get help right away if you have a lot of blood in your stool, nausea or vomiting, a fever, or increased pain in your abdomen. This information is not intended to replace advice given to you by your health care provider. Make sure you discuss any questions you have with your health care provider. Document Revised: 07/01/2021 Document Reviewed: 07/01/2021 Elsevier Patient Education  [place] After This sheet gives you information about how to care for yourself after your procedure. Your health care provider may also give you more specific instructions. If you have problems or questions, contact your health care provider. What can I expect after the procedure? After the procedure, it is common to have: Tiredness. Forgetfulness about what happened after the procedure. Impaired judgment for important decisions. Nausea or vomiting. Some difficulty with balance. Follow these instructions at home: For the time period you were told by your health care provider:     Rest as needed. Do not participate in activities where you could fall or become injured. Do not drive or use machinery. Do not drink alcohol. Do not take sleeping pills or medicines that cause drowsiness. Do not make important decisions or sign legal documents. Do not take care of children on your own. Eating and drinking Follow the  diet that is recommended by your health care provider. Drink enough fluid to keep your urine pale yellow. If you vomit: Drink water, juice, or soup when you can drink without vomiting. Make sure you have little or no nausea before eating solid foods. General instructions Have a responsible adult stay with you for the time you are told. It is important to have someone help care for you until you are awake and alert. Take over-the-counter and prescription medicines only as told by your health care provider. If you have sleep apnea, surgery and certain medicines can increase your risk for breathing problems. Follow instructions from your health care provider about wearing your sleep device: Anytime you are sleeping, including during daytime naps. While taking prescription pain medicines, sleeping medicines, or medicines that make you drowsy. Avoid smoking. Keep all follow-up visits as told by  your health care provider. This is important. Contact a health care provider if: You keep feeling nauseous or you keep vomiting. You feel light-headed. You are still sleepy or having trouble with balance after 24 hours. You develop a rash. You have a fever. You have redness or swelling around the IV site. Get help right away if: You have trouble breathing. You have new-onset confusion at home. Summary For several hours after your procedure, you may feel tired. You may also be forgetful and have poor judgment. Have a responsible adult stay with you for the time you are told. It is important to have someone help care for you until you are awake and alert. Rest as told. Do not drive or operate machinery. Do not drink alcohol or take sleeping pills. Get help right away if you have trouble breathing, or if you suddenly become confused. This information is not intended to replace advice given to you by your health care provider. Make sure you discuss any questions you have with your health care  provider. Document Revised: 10/13/2021 Document Reviewed: 10/11/2019 Elsevier Patient Education  [place].

## 2022-05-04 ENCOUNTER — Encounter (HOSPITAL_COMMUNITY)
Admission: RE | Admit: 2022-05-04 | Discharge: 2022-05-04 | Disposition: A | Discharge status: Home / Self Care | Payer: Medicare HMO | Source: Ambulatory Visit | Attending: Internal Medicine | Admitting: Internal Medicine

## 2022-05-04 ENCOUNTER — Encounter (HOSPITAL_COMMUNITY): Payer: Self-pay

## 2022-05-04 VITALS — BP 142/93 | HR 78 | Temp 97.5°F | Resp 18 | Ht 63.0 in | Wt 170.0 lb

## 2022-05-04 DIAGNOSIS — Z01812 Encounter for preprocedural laboratory examination: Secondary | ICD-10-CM | POA: Insufficient documentation

## 2022-05-04 DIAGNOSIS — K7581 Nonalcoholic steatohepatitis (NASH): Secondary | ICD-10-CM | POA: Diagnosis not present

## 2022-05-04 DIAGNOSIS — Z01818 Encounter for other preprocedural examination: Secondary | ICD-10-CM

## 2022-05-04 HISTORY — DX: Depression, unspecified: F32.A

## 2022-05-04 HISTORY — DX: Anxiety disorder, unspecified: F41.9

## 2022-05-05 LAB — POCT PREGNANCY, URINE: Preg Test, Ur: NEGATIVE

## 2022-05-07 ENCOUNTER — Ambulatory Visit (HOSPITAL_COMMUNITY): Payer: Medicare HMO | Admitting: Certified Registered"

## 2022-05-07 ENCOUNTER — Ambulatory Visit (HOSPITAL_COMMUNITY)
Admission: RE | Admit: 2022-05-07 | Discharge: 2022-05-07 | Disposition: A | Discharge status: Home / Self Care | Payer: Medicare HMO | Attending: Internal Medicine | Admitting: Internal Medicine

## 2022-05-07 ENCOUNTER — Encounter (HOSPITAL_COMMUNITY): Payer: Self-pay | Admitting: Internal Medicine

## 2022-05-07 ENCOUNTER — Telehealth: Payer: Self-pay

## 2022-05-07 ENCOUNTER — Encounter (HOSPITAL_COMMUNITY)
Admission: RE | Disposition: A | Discharge status: Home / Self Care | Payer: Self-pay | Source: Home / Self Care | Attending: Internal Medicine

## 2022-05-07 ENCOUNTER — Ambulatory Visit (HOSPITAL_BASED_OUTPATIENT_CLINIC_OR_DEPARTMENT_OTHER): Payer: Medicare HMO | Admitting: Certified Registered"

## 2022-05-07 DIAGNOSIS — F17210 Nicotine dependence, cigarettes, uncomplicated: Secondary | ICD-10-CM | POA: Diagnosis not present

## 2022-05-07 DIAGNOSIS — D122 Benign neoplasm of ascending colon: Secondary | ICD-10-CM | POA: Diagnosis not present

## 2022-05-07 DIAGNOSIS — Z1211 Encounter for screening for malignant neoplasm of colon: Secondary | ICD-10-CM | POA: Insufficient documentation

## 2022-05-07 DIAGNOSIS — F418 Other specified anxiety disorders: Secondary | ICD-10-CM | POA: Diagnosis not present

## 2022-05-07 DIAGNOSIS — K635 Polyp of colon: Secondary | ICD-10-CM | POA: Diagnosis not present

## 2022-05-07 HISTORY — PX: COLONOSCOPY WITH PROPOFOL: SHX5780

## 2022-05-07 SURGERY — COLONOSCOPY WITH PROPOFOL
Anesthesia: General

## 2022-05-07 MED ORDER — LACTATED RINGERS IV SOLN: INTRAVENOUS | Status: DC | PRN

## 2022-05-07 MED ORDER — PROPOFOL 10 MG/ML IV BOLUS
INTRAVENOUS | Status: DC | PRN
  Administered 2022-05-07 (×2): 50 mg via INTRAVENOUS
  Administered 2022-05-07: 100 mg via INTRAVENOUS

## 2022-05-07 MED ORDER — PANTOPRAZOLE SODIUM 40 MG PO TBEC: 40.0000 mg | DELAYED_RELEASE_TABLET | Freq: Every day | ORAL | 0 refills | Status: DC

## 2022-05-07 MED ORDER — PROPOFOL 500 MG/50ML IV EMUL
INTRAVENOUS | Status: DC | PRN
  Administered 2022-05-07: 150 ug/kg/min via INTRAVENOUS

## 2022-05-07 MED ORDER — LIDOCAINE HCL (CARDIAC) PF 100 MG/5ML IV SOSY
PREFILLED_SYRINGE | INTRAVENOUS | Status: DC | PRN
Start: 2022-05-07 — End: 2022-05-07
  Administered 2022-05-07: 50 mg via INTRAVENOUS
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
## 2022-05-07 NOTE — Anesthesia Preprocedure Evaluation (Signed)
Anesthesia Evaluation  Patient identified by MRN, date of birth, ID band Patient awake    Reviewed: Allergy & Precautions, H&P , NPO status , Patient's Chart, lab work & pertinent test results, reviewed documented beta blocker date and time   History of Anesthesia Complications (+) PSEUDOCHOLINESTERASE DEFICIENCY and history of anesthetic complications  Airway Mallampati: II  TM Distance: >3 FB Neck ROM: full    Dental no notable dental hx.    Pulmonary neg pulmonary ROS, Current Smoker and Patient abstained from smoking.,    Pulmonary exam normal breath sounds clear to auscultation       Cardiovascular Exercise Tolerance: Good negative cardio ROS   Rhythm:regular Rate:Normal     Neuro/Psych  Headaches, PSYCHIATRIC DISORDERS Anxiety Depression  Neuromuscular disease    GI/Hepatic negative GI ROS, Neg liver ROS,   Endo/Other  negative endocrine ROS  Renal/GU negative Renal ROS  negative genitourinary   Musculoskeletal   Abdominal   Peds  Hematology negative hematology ROS (+)   Anesthesia Other Findings   Reproductive/Obstetrics negative OB ROS                             Anesthesia Physical Anesthesia Plan  ASA: 2  Anesthesia Plan: General   Post-op Pain Management:    Induction:   PONV Risk Score and Plan: Propofol infusion  Airway Management Planned:   Additional Equipment:   Intra-op Plan:   Post-operative Plan:   Informed Consent: I have reviewed the patients History and Physical, chart, labs and discussed the procedure including the risks, benefits and alternatives for the proposed anesthesia with the patient or authorized representative who has indicated his/her understanding and acceptance.     Dental Advisory Given  Plan Discussed with: CRNA  Anesthesia Plan Comments:         Anesthesia Quick Evaluation

## 2022-05-07 NOTE — Telephone Encounter (Signed)
Sent in a 90 day supply. Pt is due for a follow up in 3 months.

## 2022-05-07 NOTE — Anesthesia Postprocedure Evaluation (Signed)
Anesthesia Post Note  Patient: Emily Weeks  Procedure(s) Performed: COLONOSCOPY WITH PROPOFOL POLYPECTOMY  Patient location during evaluation: Phase II Anesthesia Type: General Level of consciousness: awake Pain management: pain level controlled Vital Signs Assessment: post-procedure vital signs reviewed and stable Respiratory status: spontaneous breathing and respiratory function stable Cardiovascular status: blood pressure returned to baseline and stable Postop Assessment: no headache and no apparent nausea or vomiting Anesthetic complications: no Comments: Late entry   No notable events documented.   Last Vitals:  Vitals:   05/07/22 0834 05/07/22 1009  BP: 137/60 118/80  Pulse: 75 89  Resp: (!) 22 (!) 21  Temp: 36.7 C   SpO2: 98% 97%    Last Pain:  Vitals:   05/07/22 1009  TempSrc:   PainSc: 0-No pain                 Louann Sjogren

## 2022-05-07 NOTE — Transfer of Care (Signed)
Immediate Anesthesia Transfer of Care Note  Patient: Emily Weeks  Procedure(s) Performed: COLONOSCOPY WITH PROPOFOL POLYPECTOMY  Patient Location: Short Stay  Anesthesia Type:General  Level of Consciousness: awake  Airway & Oxygen Therapy: Patient Spontanous Breathing  Post-op Assessment: Report given to RN and Post -op Vital signs reviewed and stable  Post vital signs: Reviewed and stable  Last Vitals:  Vitals Value Taken Time  BP    Temp    Pulse    Resp    SpO2      Last Pain:  Vitals:   05/07/22 0944  TempSrc:   PainSc: 0-No pain      Patients Stated Pain Goal: 8 (93/81/01 7510)  Complications: No notable events documented.

## 2022-05-07 NOTE — Op Note (Signed)
[facility] Patient Name: Emily Weeks Procedure Date: 05/07/2022 9:32 AM MRN: [id number] Date of Birth: [birth year] Attending MD: Norvel Richards , MD CSN: [id number] Age: 51 Admit Type: Outpatient Procedure:                Colonoscopy Indications:              Screening for colorectal malignant neoplasm Providers:                Norvel Richards, MD, Charlsie Quest. Theda Sers RN, RN,                            Raphael Gibney, Technician Referring MD:              Medicines:                Propofol per Anesthesia Complications:            No immediate complications. Estimated Blood Loss:     Estimated blood loss was minimal. Procedure:                Pre-Anesthesia Assessment:                           - Prior to the procedure, a History and Physical                            was performed, and patient medications and                            allergies were reviewed. The patient's tolerance of                            previous anesthesia was also reviewed. The risks                            and benefits of the procedure and the sedation                            options and risks were discussed with the patient.                            All questions were answered, and informed consent                            was obtained. Prior Anticoagulants: The patient has                            taken no previous anticoagulant or antiplatelet                            agents. ASA Grade Assessment: III - A patient with                            severe systemic disease. After reviewing the risks  and benefits, the patient was deemed in                            satisfactory condition to undergo the procedure.                           After obtaining informed consent, the colonoscope                            was passed under direct vision. Throughout the                            procedure, the patient's blood pressure, pulse, and                             oxygen saturations were monitored continuously. The                            [id number]) scope was introduced through the                            anus and advanced to the the cecum, identified by                            appendiceal orifice and ileocecal valve. The                            colonoscopy was performed without difficulty. The                            patient tolerated the procedure well. The quality                            of the bowel preparation was adequate. Scope In: 9:50:17 AM Scope Out: 10:04:57 AM Scope Withdrawal Time: 0 hours 9 minutes 19 seconds  Total Procedure Duration: 0 hours 14 minutes 40 seconds  Findings:      The perianal and digital rectal examinations were normal.      A 5 mm polyp was found in the ascending colon. The polyp was       semi-pedunculated. The polyp was removed with a cold snare. Resection       and retrieval were complete. Estimated blood loss was minimal. Estimated       blood loss was minimal.      The exam was otherwise without abnormality on direct and retroflexion       views. Impression:               - One 5 mm polyp in the ascending colon, removed                            with a cold snare. Resected and retrieved.                           - The examination was otherwise normal on direct  and retroflexion views. Moderate Sedation:      Moderate (conscious) sedation was personally administered by an       anesthesia professional. The following parameters were monitored: oxygen       saturation, heart rate, blood pressure, respiratory rate, EKG, adequacy       of pulmonary ventilation, and response to care. Recommendation:           - Patient has a contact number available for                            emergencies. The signs and symptoms of potential                            delayed complications were discussed with the                            patient. Return to  normal activities tomorrow.                            Written discharge instructions were provided to the                            patient.                           - Resume previous diet.                           - Continue present medications.                           - Repeat colonoscopy date to be determined after                            pending pathology results are reviewed for                            surveillance.                           - Return to GI office in 2 months. Procedure Code(s):        --- Professional ---                           [phone], Colonoscopy, flexible; with removal of                            tumor(s), polyp(s), or other lesion(s) by snare                            technique Diagnosis Code(s):        --- Professional ---                           Z12.11, Encounter for screening for malignant  neoplasm of colon                           K63.5, Polyp of colon CPT copyright 2019 American [employer] Association. All rights reserved. The codes documented in this report are preliminary and upon coder review may  be revised to meet current compliance requirements. Cristopher Estimable. Bobbyjoe Pabst, MD Norvel Richards, MD 05/07/2022 10:12:51 AM This report has been signed electronically. Number of Addenda: 0

## 2022-05-07 NOTE — Discharge Instructions (Signed)
  Colonoscopy Discharge Instructions  Read the instructions outlined below and refer to this sheet in the next few weeks. These discharge instructions provide you with general information on caring for yourself after you leave the hospital. Your doctor may also give you specific instructions. While your treatment has been planned according to the most current medical practices available, unavoidable complications occasionally occur. If you have any problems or questions after discharge, call Dr. Gala Romney at [phone]. ACTIVITY You may resume your regular activity, but move at a slower pace for the next 24 hours.  Take frequent rest periods for the next 24 hours.  Walking will help get rid of the air and reduce the bloated feeling in your belly (abdomen).  No driving for 24 hours (because of the medicine (anesthesia) used during the test).   Do not sign any important legal documents or operate any machinery for 24 hours (because of the anesthesia used during the test).  NUTRITION Drink plenty of fluids.  You may resume your normal diet as instructed by your doctor.  Begin with a light meal and progress to your normal diet. Heavy or fried foods are harder to digest and may make you feel sick to your stomach (nauseated).  Avoid alcoholic beverages for 24 hours or as instructed.  MEDICATIONS You may resume your normal medications unless your doctor tells you otherwise.  WHAT YOU CAN EXPECT TODAY Some feelings of bloating in the abdomen.  Passage of more gas than usual.  Spotting of blood in your stool or on the toilet paper.  IF YOU HAD POLYPS REMOVED DURING THE COLONOSCOPY: No aspirin products for 7 days or as instructed.  No alcohol for 7 days or as instructed.  Eat a soft diet for the next 24 hours.  FINDING OUT THE RESULTS OF YOUR TEST Not all test results are available during your visit. If your test results are not back during the visit, make an appointment with your caregiver to find out the  results. Do not assume everything is normal if you have not heard from your caregiver or the medical facility. It is important for you to follow up on all of your test results.  SEEK IMMEDIATE MEDICAL ATTENTION IF: You have more than a spotting of blood in your stool.  Your belly is swollen (abdominal distention).  You are nauseated or vomiting.  You have a temperature over 101.  You have abdominal pain or discomfort that is severe or gets worse throughout the day.     1 polyp removed from your colon  Further recommendations to follow pending review of pathology report  Keep your upcoming appointment with [place] in the office  At patient request, I called Ledon Snare at [phone] findings and recommendations

## 2022-05-07 NOTE — Anesthesia Procedure Notes (Signed)
Date/Time: 05/07/2022 9:57 AM  Performed by: Orlie Dakin, CRNAPre-anesthesia Checklist: Patient identified, Emergency Drugs available, Suction available and Patient being monitored Patient Re-evaluated:Patient Re-evaluated prior to induction Oxygen Delivery Method: Nasal cannula Induction Type: IV induction Placement Confirmation: positive ETCO2

## 2022-05-07 NOTE — Telephone Encounter (Signed)
-----   Message from Daneil Dolin, MD sent at 05/07/2022  9:44 AM EDT ----- Patient here for colonoscopy today.  Said she got her prep from the drugstore but there was no prescription for Protonix (I presume this is the medication she was talking about a as it looks like it was prescribed when she was last in the office.).  Incidentally, she did not call the office to let us know she did not get the medicine.  Can we check on this?

## 2022-05-09 NOTE — Telephone Encounter (Signed)
Thank you! Please make sure patient is aware. Let me know if anything further is needed. She is on recall for OV in August.

## 2022-05-10 ENCOUNTER — Encounter: Payer: Self-pay | Admitting: Internal Medicine

## 2022-05-10 LAB — SURGICAL PATHOLOGY

## 2022-05-11 NOTE — Telephone Encounter (Signed)
Pt was notified.  

## 2022-05-13 ENCOUNTER — Encounter (HOSPITAL_COMMUNITY): Payer: Self-pay | Admitting: Internal Medicine

## 2022-06-08 ENCOUNTER — Encounter: Payer: Self-pay | Admitting: Internal Medicine

## 2022-06-09 ENCOUNTER — Encounter: Payer: Self-pay | Admitting: Internal Medicine

## 2022-06-11 ENCOUNTER — Ambulatory Visit (INDEPENDENT_AMBULATORY_CARE_PROVIDER_SITE_OTHER): Payer: Medicare HMO | Admitting: Family Medicine

## 2022-06-11 ENCOUNTER — Ambulatory Visit: Payer: Medicare HMO

## 2022-06-11 ENCOUNTER — Encounter: Payer: Self-pay | Admitting: Family Medicine

## 2022-06-11 VITALS — BP 109/67 | HR 82 | Temp 97.6°F | Ht 63.0 in | Wt 175.5 lb

## 2022-06-11 DIAGNOSIS — M791 Myalgia, unspecified site: Secondary | ICD-10-CM

## 2022-06-11 DIAGNOSIS — W57XXXA Bitten or stung by nonvenomous insect and other nonvenomous arthropods, initial encounter: Secondary | ICD-10-CM | POA: Diagnosis not present

## 2022-06-11 DIAGNOSIS — R5383 Other fatigue: Secondary | ICD-10-CM

## 2022-06-11 MED ORDER — DOXYCYCLINE HYCLATE 100 MG PO TABS: 100.0000 mg | ORAL_TABLET | Freq: Two times a day (BID) | ORAL | 0 refills | Status: AC

## 2022-06-11 NOTE — Progress Notes (Signed)
   Acute Office Visit  Subjective:     Patient ID: Genowefa Perman, female    DOB: 11/05/1971, 51 y.o.   MRN: [id number]  Chief Complaint  Patient presents with   Fatigue    HPI Patient is in today for fatigue. This started about 1 week ago. She reports a headache, neck pain, and joint pain as well. She had diarrhea a few days ago as well. She denies changes in gait, visual disturbances, chills, abdominal pain, vomiting, shortness of breath, or rash. She is concerned because she has had at least 12 tick bites in the last 2 months with the most recent one about 2 weeks ago. Denies URI symptoms.   ROS As per HPI.      Objective:    BP 109/67   Pulse 82   Temp 97.6 F (36.4 C) (Temporal)   Ht 5' 3" (1.6 m)   Wt 175 lb 8 oz (79.6 kg)   SpO2 94%   BMI 31.09 kg/m    Physical Exam Vitals and nursing note reviewed.  Constitutional:      General: She is not in acute distress.    Appearance: She is not ill-appearing, toxic-appearing or diaphoretic.  HENT:     Nose: Nose normal.  Cardiovascular:     Rate and Rhythm: Normal rate and regular rhythm.     Heart sounds: Normal heart sounds. No murmur heard. Pulmonary:     Effort: Pulmonary effort is normal. No respiratory distress.     Breath sounds: Normal breath sounds.  Abdominal:     General: Bowel sounds are normal. There is no distension.     Palpations: Abdomen is soft.     Tenderness: There is no abdominal tenderness. There is no guarding or rebound.  Musculoskeletal:     Cervical back: No rigidity or tenderness.     Right lower leg: No edema.     Left lower leg: No edema.  Skin:    General: Skin is warm and dry.  Neurological:     General: No focal deficit present.     Mental Status: She is alert and oriented to person, place, and time.     Motor: No weakness.     Gait: Gait normal.  Psychiatric:        Mood and Affect: Mood normal.        Behavior: Behavior normal.     No results found for any visits on  06/11/22.      Assessment & Plan:   Kariel was seen today for fatigue.  Diagnoses and all orders for this visit:  Tick bite of multiple sites Myalgia Fatigue, unspecified type Will start treatment with doxycycline empirically for lyme/RMSF given reports symptoms. Labs pending as below. Handout given for tick bites. Discussed return precautions.  -     doxycycline (VIBRA-TABS) 100 MG tablet; Take 1 tablet (100 mg total) by mouth 2 (two) times daily for 10 days. 1 po bid -     CBC with Differential/Platelet -     TSH -     Alpha-Gal Panel -     Lyme Disease Serology w/Reflex -     Rocky mtn spotted fvr abs pnl(IgG+IgM) -     CMP14+EGFR  Return in about 11 weeks (around 08/27/2022) for chronic follow up.  The patient indicates understanding of these issues and agrees with the plan.  [name] M Morgan, FNP   

## 2022-06-11 NOTE — Patient Instructions (Signed)
Tick Bite Information, Adult Ticks are insects that draw blood for food. Most ticks live in shrubs and grassy and wooded areas. They climb onto people and animals that brush against the leaves and grasses that they rest on. Then they bite, attaching themselves to the skin. Most ticks are harmless, but some ticks may carry germs that can spread to a person through a bite and cause a disease. To reduce your risk of getting a disease from a tick bite, make sure you: Take steps to prevent tick bites. Check for ticks after being outdoors where ticks live. Watch for symptoms of disease if a tick attached to you or if you suspect a tick bite. How can I prevent tick bites? Take these steps to help prevent tick bites when you go outdoors in an area where ticks live: Use insect repellent Use insect repellent that has DEET (20% or higher), picaridin, or IR3535 in it. Follow the instructions on the label. Use these products on: Bare skin. The top of your boots. Your pant legs. Your sleeve cuffs. For insect repellent that contains permethrin, follow the instructions on the label. Use these products on: Clothing. Boots. Outdoor gear. Tents. When you are outside Wear protective clothing. Long sleeves and long pants offer the best protection from ticks. Wear light-colored clothing so you can see ticks more easily. Tuck your pant legs into your socks. If you go walking on a trail, stay in the middle of the trail so your skin, hair, and clothing do not touch the bushes. Avoid walking through areas with long grass. Check for ticks on your clothing, hair, and skin often while you are outside, and check again before you go inside. Make sure to check the scalp, neck, armpits, waist, groin, and joint areas. These are the spots where ticks attach themselves most often. When you go indoors Check your clothing for ticks. Tumble dry clothes in a dryer on high heat for at least 10 minutes. If clothes are damp,  additional time may be needed. If clothes require washing, use hot water. Examine gear and pets. Shower soon after being outdoors. Check your body for ticks. Conduct a full body check using a mirror. What is the proper way to remove a tick? If you find a tick on your body, remove it as soon as possible. Removing a tick sooner can prevent germs from passing to your body. Do not remove the tick with your bare fingers. To remove a tick that is crawling on your skin but has not bitten, use either of these methods: Go outdoors and brush the tick off. Remove the tick with tape or a lint roller. To remove a tick that is attached to your skin: Wash your hands. If you have latex gloves, put them on. Use fine-tipped tweezers, curved forceps, or a tick-removal tool to gently grasp the tick as close to your skin and the tick's head as possible. Gently pull with a steady, upward, even pressure until the tick lets go. When removing the tick: Take care to keep the tick's head attached to its body. Do not twist or jerk the tick. This can make the tick's head or mouth parts break off and remain in the skin. Do not squeeze or crush the tick's body. This could force disease-carrying fluids from the tick into your body. Do not try to remove a tick with heat, alcohol, petroleum jelly, or fingernail polish. Using these methods can cause the tick to salivate and regurgitate into your bloodstream,  increasing your risk of getting a disease. What should I do after removing a tick? Dispose of the tick. Do not crush a tick with your fingers. Clean the bite area and your hands with soap and water, rubbing alcohol, or an iodine scrub. If an antiseptic cream or ointment is available, apply a small amount to the bite site. Wash and disinfect any instruments that you used to remove the tick. How should I dispose of a tick? To dispose of a live tick, use one of these methods: Place it in rubbing alcohol. Place it in a sealed  bag or container. Wrap it tightly in tape. Flush it down the toilet. Contact a health care provider if: You have symptoms of a disease after a tick bite. Symptoms of a tick-borne disease can occur from moments after the tick bites to 30 days after a tick is removed. Symptoms include: Fever or chills. Any of these signs in the bite area: A red rash that makes a circle (bull's-eye rash) in the bite area. Redness and swelling. Headache. Muscle, joint, or bone pain. Abnormal tiredness. Numbness in your legs or difficulty walking or moving your legs. Tender, swollen lymph glands. A part of a tick breaks off and gets stuck in your skin. Get help right away if: You are not able to remove a tick. You experience muscle weakness or paralysis. Your symptoms get worse or you experience new symptoms. You find an engorged tick on your skin and you are in an area where disease from ticks is a high risk. Summary Ticks may carry germs that can spread to a person through a bite and cause a disease. Wear protective clothing and use insect repellent to prevent tick bites. Follow the instructions on the label. If you find a tick on your body, remove it as soon as possible. If the tick is attached, do not try to remove with heat, alcohol, petroleum jelly, or fingernail polish. Remove the attached tick using fine-tipped tweezers, curved forceps, or a tick-removal tool. Gently pull with steady, upward, even pressure until the tick lets go. Do not twist or jerk the tick. Do not squeeze or crush the tick's body. If you have symptoms of a disease after being bitten by a tick, contact a health care provider. This information is not intended to replace advice given to you by your health care provider. Make sure you discuss any questions you have with your health care provider. Document Revised: 11/05/2019 Document Reviewed: 11/05/2019 Elsevier Patient Education  [place].

## 2022-06-15 LAB — CMP14+EGFR
ALT: 26 IU/L (ref 0–32)
AST: 18 IU/L (ref 0–40)
Albumin/Globulin Ratio: 1.9 (ref 1.2–2.2)
Albumin: 4.3 g/dL (ref 3.9–4.9)
Alkaline Phosphatase: 73 IU/L (ref 44–121)
BUN/Creatinine Ratio: 15 (ref 9–23)
BUN: 13 mg/dL (ref 6–24)
Bilirubin Total: 0.3 mg/dL (ref 0.0–1.2)
CO2: 24 mmol/L (ref 20–29)
Calcium: 9.4 mg/dL (ref 8.7–10.2)
Chloride: 102 mmol/L (ref 96–106)
Creatinine, Ser: 0.87 mg/dL (ref 0.57–1.00)
Globulin, Total: 2.3 g/dL (ref 1.5–4.5)
Glucose: 96 mg/dL (ref 70–99)
Potassium: 4.9 mmol/L (ref 3.5–5.2)
Sodium: 141 mmol/L (ref 134–144)
Total Protein: 6.6 g/dL (ref 6.0–8.5)
eGFR: 82 mL/min/{1.73_m2} (ref >59)

## 2022-06-15 LAB — CBC WITH DIFFERENTIAL/PLATELET
Basophils Absolute: 0.1 10*3/uL (ref 0.0–0.2)
Basos: 1 %
EOS (ABSOLUTE): 0.3 10*3/uL (ref 0.0–0.4)
Eos: 3 %
Hematocrit: 40.5 % (ref 34.0–46.6)
Hemoglobin: 14 g/dL (ref 11.1–15.9)
Immature Grans (Abs): 0 10*3/uL (ref 0.0–0.1)
Immature Granulocytes: 0 %
Lymphocytes Absolute: 5.1 10*3/uL — ABNORMAL HIGH (ref 0.7–3.1)
Lymphs: 47 %
MCH: 30.9 pg (ref 26.6–33.0)
MCHC: 34.6 g/dL (ref 31.5–35.7)
MCV: 89 fL (ref 79–97)
Monocytes Absolute: 0.8 10*3/uL (ref 0.1–0.9)
Monocytes: 7 %
Neutrophils Absolute: 4.7 10*3/uL (ref 1.4–7.0)
Neutrophils: 42 %
Platelets: 308 10*3/uL (ref 150–450)
RBC: 4.53 x10E6/uL (ref 3.77–5.28)
RDW: 13 % (ref 11.7–15.4)
WBC: 11 10*3/uL — ABNORMAL HIGH (ref 3.4–10.8)

## 2022-06-15 LAB — ALPHA-GAL PANEL
Allergen Lamb IgE: <0.1 kU/L
Beef IgE: <0.1 kU/L
IgE (Immunoglobulin E), Serum: 87 IU/mL (ref 6–495)
O215-IgE Alpha-Gal: <0.1 kU/L
Pork IgE: <0.1 kU/L

## 2022-06-15 LAB — ROCKY MTN SPOTTED FVR ABS PNL(IGG+IGM)
RMSF IgG: NEGATIVE
RMSF IgM: 0.64 index (ref 0.00–0.89)

## 2022-06-15 LAB — LYME DISEASE SEROLOGY W/REFLEX: Lyme Total Antibody EIA: NEGATIVE

## 2022-06-15 LAB — TSH: TSH: 1.05 u[IU]/mL (ref 0.450–4.500)
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
## 2022-07-02 ENCOUNTER — Encounter: Payer: Self-pay | Admitting: Gastroenterology

## 2022-07-02 ENCOUNTER — Telehealth (INDEPENDENT_AMBULATORY_CARE_PROVIDER_SITE_OTHER): Payer: Medicare HMO | Admitting: Gastroenterology

## 2022-07-02 ENCOUNTER — Telehealth: Payer: Self-pay | Admitting: Gastroenterology

## 2022-07-02 VITALS — Ht 63.0 in | Wt 175.0 lb

## 2022-07-02 DIAGNOSIS — R12 Heartburn: Secondary | ICD-10-CM | POA: Diagnosis not present

## 2022-07-02 DIAGNOSIS — R1011 Right upper quadrant pain: Secondary | ICD-10-CM

## 2022-07-02 NOTE — Telephone Encounter (Signed)
Spoke with pt. Scheduled for 9/7 at 11:15am. Aware will send instructions to [facility]

## 2022-07-02 NOTE — Telephone Encounter (Signed)
Mindy:  Please arrange EGD with propofol with Dr. Gala Romney. Dx: Postprandial RUQ abdominal pain, early satiety ASA 2

## 2022-07-02 NOTE — Patient Instructions (Signed)
We will arrange for you to have an upper endoscopy in the near future with Dr. Gala Romney.  Continue pantoprazole 40 mg daily 30 minutes before breakfast.  Continue to limit/avoid all NSAID products including ibuprofen, Aleve, Advil, BC powders, Goody powders, and anything that says "NSAID" on the package.  Avoid alcohol for now.  We will follow-up with you after your upper endoscopy.  It was great to see you again today!  Aliene Altes, PA-C [facility]

## 2022-07-05 NOTE — Telephone Encounter (Signed)
PA approved via cohere. Auth# Authorization #[id number], DOS:  07/29/2022 - 10/27/2022

## 2022-07-20 ENCOUNTER — Other Ambulatory Visit: Payer: Self-pay | Admitting: Gastroenterology

## 2022-07-28 ENCOUNTER — Telehealth: Payer: Self-pay | Admitting: *Deleted

## 2022-07-28 ENCOUNTER — Telehealth: Payer: Self-pay | Admitting: Family Medicine

## 2022-07-28 NOTE — Telephone Encounter (Signed)
Patient left vm stating that she had an EGD scheduled for tomorrow and needed the instructions.   [place]

## 2022-07-28 NOTE — Telephone Encounter (Signed)
PT R/C

## 2022-07-28 NOTE — Telephone Encounter (Signed)
Advised pt to call the provider doing the procedure to get instructions and pt voiced understanding.

## 2022-07-29 ENCOUNTER — Ambulatory Visit (HOSPITAL_COMMUNITY): Payer: Medicare HMO | Admitting: Anesthesiology

## 2022-07-29 ENCOUNTER — Encounter (HOSPITAL_COMMUNITY): Payer: Self-pay | Admitting: Internal Medicine

## 2022-07-29 ENCOUNTER — Ambulatory Visit (HOSPITAL_BASED_OUTPATIENT_CLINIC_OR_DEPARTMENT_OTHER): Payer: Medicare HMO | Admitting: Anesthesiology

## 2022-07-29 ENCOUNTER — Encounter (HOSPITAL_COMMUNITY)
Admission: RE | Disposition: A | Discharge status: Home / Self Care | Payer: Self-pay | Source: Ambulatory Visit | Attending: Internal Medicine

## 2022-07-29 ENCOUNTER — Ambulatory Visit (HOSPITAL_COMMUNITY)
Admission: RE | Admit: 2022-07-29 | Discharge: 2022-07-29 | Disposition: A | Discharge status: Home / Self Care | Payer: Medicare HMO | Source: Ambulatory Visit | Attending: Internal Medicine | Admitting: Internal Medicine

## 2022-07-29 ENCOUNTER — Telehealth: Payer: Self-pay

## 2022-07-29 ENCOUNTER — Other Ambulatory Visit: Payer: Self-pay

## 2022-07-29 DIAGNOSIS — M1990 Unspecified osteoarthritis, unspecified site: Secondary | ICD-10-CM | POA: Insufficient documentation

## 2022-07-29 DIAGNOSIS — F418 Other specified anxiety disorders: Secondary | ICD-10-CM | POA: Diagnosis not present

## 2022-07-29 DIAGNOSIS — F17200 Nicotine dependence, unspecified, uncomplicated: Secondary | ICD-10-CM | POA: Diagnosis not present

## 2022-07-29 DIAGNOSIS — K317 Polyp of stomach and duodenum: Secondary | ICD-10-CM | POA: Diagnosis not present

## 2022-07-29 DIAGNOSIS — G709 Myoneural disorder, unspecified: Secondary | ICD-10-CM | POA: Diagnosis not present

## 2022-07-29 DIAGNOSIS — K7581 Nonalcoholic steatohepatitis (NASH): Secondary | ICD-10-CM | POA: Insufficient documentation

## 2022-07-29 DIAGNOSIS — R1013 Epigastric pain: Secondary | ICD-10-CM | POA: Diagnosis not present

## 2022-07-29 DIAGNOSIS — R1011 Right upper quadrant pain: Secondary | ICD-10-CM

## 2022-07-29 HISTORY — PX: ESOPHAGOGASTRODUODENOSCOPY (EGD) WITH PROPOFOL: SHX5813

## 2022-07-29 HISTORY — PX: POLYPECTOMY: SHX5525

## 2022-07-29 SURGERY — ESOPHAGOGASTRODUODENOSCOPY (EGD) WITH PROPOFOL
Anesthesia: General

## 2022-07-29 MED ORDER — DEXLANSOPRAZOLE 60 MG PO CPDR: 60.0000 mg | DELAYED_RELEASE_CAPSULE | Freq: Every day | ORAL | 11 refills | Status: DC

## 2022-07-29 MED ORDER — LACTATED RINGERS IV SOLN: INTRAVENOUS | Status: DC

## 2022-07-29 MED ORDER — PROPOFOL 10 MG/ML IV BOLUS
INTRAVENOUS | Status: DC | PRN
  Administered 2022-07-29: 100 mg via INTRAVENOUS
  Administered 2022-07-29: 50 mg via INTRAVENOUS

## 2022-07-29 MED ORDER — LIDOCAINE HCL (CARDIAC) PF 100 MG/5ML IV SOSY
PREFILLED_SYRINGE | INTRAVENOUS | Status: DC | PRN
  Administered 2022-07-29: 60 mg via INTRATRACHEAL

## 2022-07-29 MED ORDER — LACTATED RINGERS IV SOLN: INTRAVENOUS | Status: DC | PRN

## 2022-07-29 NOTE — Anesthesia Postprocedure Evaluation (Signed)
Anesthesia Post Note  Patient: Emily Weeks  Procedure(s) Performed: ESOPHAGOGASTRODUODENOSCOPY (EGD) WITH PROPOFOL POLYPECTOMY  Patient location during evaluation: Phase II Anesthesia Type: General Level of consciousness: awake and alert and oriented Pain management: pain level controlled Vital Signs Assessment: post-procedure vital signs reviewed and stable Respiratory status: spontaneous breathing, nonlabored ventilation and respiratory function stable Cardiovascular status: blood pressure returned to baseline and stable Postop Assessment: no apparent nausea or vomiting Anesthetic complications: no   No notable events documented.   Last Vitals:  Vitals:   07/29/22 0943 07/29/22 1107  BP: 106/64 (!) 95/48  Pulse: 65 78  Resp: 16 (!) 22  Temp: 36.6 C 36.4 C  SpO2:  97%    Last Pain:  Vitals:   07/29/22 1110  TempSrc:   PainSc: 0-No pain                 Aletha Allebach C Jahdiel Krol

## 2022-07-29 NOTE — Anesthesia Preprocedure Evaluation (Addendum)
Anesthesia Evaluation  Patient identified by MRN, date of birth, ID band Patient awake    Reviewed: Allergy & Precautions, NPO status , Patient's Chart, lab work & pertinent test results  History of Anesthesia Complications (+) PSEUDOCHOLINESTERASE DEFICIENCY and history of anesthetic complications  Airway Mallampati: II  TM Distance: >3 FB Neck ROM: Full   Comment: Neck sx, NECK STIFFNESS Dental  (+) Dental Advisory Given, Missing   Pulmonary Current Smoker and Patient abstained from smoking.,    Pulmonary exam normal        Cardiovascular negative cardio ROS Normal cardiovascular exam Rhythm:Regular Rate:Normal     Neuro/Psych  Headaches, PSYCHIATRIC DISORDERS Anxiety Depression  Neuromuscular disease    GI/Hepatic negative GI ROS, (+) Hepatitis - ([facility])  Endo/Other  negative endocrine ROS  Renal/GU negative Renal ROS  negative genitourinary   Musculoskeletal  (+) Arthritis , Osteoarthritis,    Abdominal   Peds negative pediatric ROS (+)  Hematology negative hematology ROS (+)   Anesthesia Other Findings   Reproductive/Obstetrics negative OB ROS                            Anesthesia Physical Anesthesia Plan  ASA: 2  Anesthesia Plan: General   Post-op Pain Management: Minimal or no pain anticipated   Induction: Intravenous  PONV Risk Score and Plan:   Airway Management Planned: Nasal Cannula and Natural Airway  Additional Equipment:   Intra-op Plan:   Post-operative Plan:   Informed Consent: I have reviewed the patients History and Physical, chart, labs and discussed the procedure including the risks, benefits and alternatives for the proposed anesthesia with the patient or authorized representative who has indicated his/her understanding and acceptance.     Dental advisory given  Plan Discussed with: CRNA and Surgeon  Anesthesia Plan Comments:          Anesthesia Quick Evaluation
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
## 2022-07-29 NOTE — Op Note (Signed)
[facility] Patient Name: Emily Weeks Procedure Date: 07/29/2022 10:40 AM MRN: [id number] Date of Birth: Apr 09, 1971 Attending MD: Norvel Richards , MD CSN: [id number] Age: 51 Admit Type: Outpatient Procedure:                Upper GI endoscopy Indications:              Epigastric abdominal pain Providers:                Norvel Richards, MD, Caprice Kluver, Raphael Gibney, Technician Referring MD:              Medicines:                Propofol per Anesthesia Complications:            No immediate complications. Estimated Blood Loss:     Estimated blood loss was minimal. Procedure:                Pre-Anesthesia Assessment:                           - Prior to the procedure, a History and Physical                            was performed, and patient medications and                            allergies were reviewed. The patient's tolerance of                            previous anesthesia was also reviewed. The risks                            and benefits of the procedure and the sedation                            options and risks were discussed with the patient.                            All questions were answered, and informed consent                            was obtained. Prior Anticoagulants: The patient has                            taken no previous anticoagulant or antiplatelet                            agents. ASA Grade Assessment: II - A patient with                            mild systemic disease. After reviewing the risks  and benefits, the patient was deemed in                            satisfactory condition to undergo the procedure.                           After obtaining informed consent, the endoscope was                            passed under direct vision. Throughout the                            procedure, the patient's blood pressure, pulse, and                            oxygen  saturations were monitored continuously. The                            GIF-[id number] ([id number]) scope was introduced through the                            mouth, and advanced to the second part of duodenum.                            The upper GI endoscopy was accomplished without                            difficulty. The patient tolerated the procedure                            well. Scope In: 10:58:46 AM Scope Out: 11:04:40 AM Total Procedure Duration: 0 hours 5 minutes 54 seconds  Findings:      The examined esophagus was normal.      Gastric cavity empty. Normal-appearing gastric mucosa except for single       5 mm polyp on the gastric body. Pylorus patent.      The duodenal bulb and second portion of the duodenum were normal.       Utilizing cold snare, gastric polyp was removed. Impression:               - Normal esophagus. Gastric polyp?"removed as                            described above                           - Normal duodenal bulb and second portion of the                            duodenum.                           -Patient states Protonix is significantly improved                            but not  totally relieved her epigastric pain. Moderate Sedation:      Moderate (conscious) sedation was personally administered by an       anesthesia professional. The following parameters were monitored: oxygen       saturation, heart rate, blood pressure, and response to care. Recommendation:           - Patient has a contact number available for                            emergencies. The signs and symptoms of potential                            delayed complications were discussed with the                            patient. Return to normal activities tomorrow.                            Written discharge instructions were provided to the                            patient.                           - Advance diet as tolerated.                           - Continue present  medications.                           - Await pathology results. Stop Protonix; begin                            [name] 60 mg daily?"30 minutes before breakfast.                            New prescription being provided through the office.                           - Return to my office in 3 months. Procedure Code(s):        --- Professional ---                           [phone], Esophagogastroduodenoscopy, flexible,                            transoral; diagnostic, including collection of                            specimen(s) by brushing or washing, when performed                            (separate procedure) Diagnosis Code(s):        --- Professional ---  R10.13, Epigastric pain CPT copyright 2019 American Medical Association. All rights reserved. The codes documented in this report are preliminary and upon coder review may  be revised to meet current compliance requirements. Cristopher Estimable. Leyan Branden, MD Norvel Richards, MD 07/29/2022 11:09:11 AM This report has been signed electronically. Number of Addenda: 0

## 2022-07-29 NOTE — Telephone Encounter (Signed)
-----   Message from Daneil Dolin, MD sent at 07/29/2022 11:13 AM EDT -----   Stopping Protonix.  Begin [name] 60 mg daily.  Take 30 minutes before breakfast.    11 refills.  Thanks.  Please call in the prescription.

## 2022-07-29 NOTE — Telephone Encounter (Signed)
Rx was sent to pharmacy on file.

## 2022-07-29 NOTE — Discharge Instructions (Addendum)
EGD Discharge instructions Please read the instructions outlined below and refer to this sheet in the next few weeks. These discharge instructions provide you with general information on caring for yourself after you leave the hospital. Your doctor may also give you specific instructions. While your treatment has been planned according to the most current medical practices available, unavoidable complications occasionally occur. If you have any problems or questions after discharge, please call your doctor. ACTIVITY You may resume your regular activity but move at a slower pace for the next 24 hours.  Take frequent rest periods for the next 24 hours.  Walking will help expel (get rid of) the air and reduce the bloated feeling in your abdomen.  No driving for 24 hours (because of the anesthesia (medicine) used during the test).  You may shower.  Do not sign any important legal documents or operate any machinery for 24 hours (because of the anesthesia used during the test).  NUTRITION Drink plenty of fluids.  You may resume your normal diet.  Begin with a light meal and progress to your normal diet.  Avoid alcoholic beverages for 24 hours or as instructed by your caregiver.  MEDICATIONS You may resume your normal medications unless your caregiver tells you otherwise.  WHAT YOU CAN EXPECT TODAY You may experience abdominal discomfort such as a feeling of fullness or "gas" pains.  FOLLOW-UP Your doctor will discuss the results of your test with you.  SEEK IMMEDIATE MEDICAL ATTENTION IF ANY OF THE FOLLOWING OCCUR: Excessive nausea (feeling sick to your stomach) and/or vomiting.  Severe abdominal pain and distention (swelling).  Trouble swallowing.  Temperature over 101 F (37.8 C).  Rectal bleeding or vomiting of blood.       1 polyp found in your stomach which was removed;  otherwise everything looked good.  Stop Protonix.  Begin [name] 60 mg daily 30 minutes before breakfast.  My  office will call in a new prescription   office visit with Aliene Altes in 3 months    LEFT MESSAGE AT OFFICE TO [place]    further recommendations to follow pending review of pathology report   at patient request, I called Allene Pyo at [phone] reviewed findings and recommendations

## 2022-07-29 NOTE — Transfer of Care (Signed)
Immediate Anesthesia Transfer of Care Note  Patient: Emily Weeks  Procedure(s) Performed: ESOPHAGOGASTRODUODENOSCOPY (EGD) WITH PROPOFOL POLYPECTOMY  Patient Location: Short Stay  Anesthesia Type:General  Level of Consciousness: awake, alert  and oriented  Airway & Oxygen Therapy: Patient Spontanous Breathing  Post-op Assessment: Report given to RN and Post -op Vital signs reviewed and stable  Post vital signs: Reviewed and stable  Last Vitals:  Vitals Value Taken Time  BP 108/59   Temp 36   Pulse 80   Resp 16   SpO2 96     Last Pain:  Vitals:   07/29/22 1055  TempSrc:   PainSc: 0-No pain      Patients Stated Pain Goal: 2 ([birth year] 0258)  Complications: No notable events documented.

## 2022-07-30 LAB — SURGICAL PATHOLOGY

## 2022-08-02 NOTE — Telephone Encounter (Signed)
Pt called stating that the dexlansoprazole is too expensive. Pt is wanting to know if there is a cheaper alternative. Pt has tried and failed omeprazole and esomeprazole. She is currently taking the pantoprazole that she was previously on.

## 2022-08-05 ENCOUNTER — Encounter (HOSPITAL_COMMUNITY): Payer: Self-pay | Admitting: Internal Medicine

## 2022-08-08 ENCOUNTER — Encounter: Payer: Self-pay | Admitting: Internal Medicine

## 2022-08-09 ENCOUNTER — Other Ambulatory Visit: Payer: Self-pay

## 2022-08-09 MED ORDER — LANSOPRAZOLE 30 MG PO CPDR: 30.0000 mg | DELAYED_RELEASE_CAPSULE | Freq: Every day | ORAL | 11 refills | Status: DC

## 2022-08-09 NOTE — Telephone Encounter (Signed)
Spoke with pt and she has not tried lansoprazole. RX was sent to pharmacy on file. Pt was made aware.

## 2022-09-02 ENCOUNTER — Encounter: Payer: Self-pay | Admitting: Family Medicine

## 2022-09-02 ENCOUNTER — Other Ambulatory Visit (HOSPITAL_COMMUNITY)
Admission: RE | Admit: 2022-09-02 | Discharge: 2022-09-02 | Disposition: A | Discharge status: Home / Self Care | Payer: Medicare HMO | Source: Ambulatory Visit | Attending: Family Medicine | Admitting: Family Medicine

## 2022-09-02 ENCOUNTER — Ambulatory Visit (INDEPENDENT_AMBULATORY_CARE_PROVIDER_SITE_OTHER): Payer: Medicare HMO | Admitting: Family Medicine

## 2022-09-02 ENCOUNTER — Ambulatory Visit (INDEPENDENT_AMBULATORY_CARE_PROVIDER_SITE_OTHER): Payer: Medicare HMO

## 2022-09-02 VITALS — BP 114/66 | HR 87 | Temp 98.2°F | Ht 63.0 in | Wt 176.2 lb

## 2022-09-02 DIAGNOSIS — R87612 Low grade squamous intraepithelial lesion on cytologic smear of cervix (LGSIL): Secondary | ICD-10-CM | POA: Insufficient documentation

## 2022-09-02 DIAGNOSIS — M542 Cervicalgia: Secondary | ICD-10-CM

## 2022-09-02 DIAGNOSIS — E782 Mixed hyperlipidemia: Secondary | ICD-10-CM

## 2022-09-02 DIAGNOSIS — M65342 Trigger finger, left ring finger: Secondary | ICD-10-CM | POA: Diagnosis not present

## 2022-09-02 DIAGNOSIS — E669 Obesity, unspecified: Secondary | ICD-10-CM

## 2022-09-02 DIAGNOSIS — Z01419 Encounter for gynecological examination (general) (routine) without abnormal findings: Secondary | ICD-10-CM | POA: Insufficient documentation

## 2022-09-02 DIAGNOSIS — Z23 Encounter for immunization: Secondary | ICD-10-CM | POA: Diagnosis not present

## 2022-09-02 DIAGNOSIS — R0683 Snoring: Secondary | ICD-10-CM

## 2022-09-02 DIAGNOSIS — R87810 Cervical high risk human papillomavirus (HPV) DNA test positive: Secondary | ICD-10-CM | POA: Insufficient documentation

## 2022-09-02 DIAGNOSIS — Z124 Encounter for screening for malignant neoplasm of cervix: Secondary | ICD-10-CM

## 2022-09-02 DIAGNOSIS — Z1151 Encounter for screening for human papillomavirus (HPV): Secondary | ICD-10-CM | POA: Diagnosis not present

## 2022-09-02 DIAGNOSIS — R400 Somnolence: Secondary | ICD-10-CM

## 2022-09-02 DIAGNOSIS — F339 Major depressive disorder, recurrent, unspecified: Secondary | ICD-10-CM

## 2022-09-02 DIAGNOSIS — F411 Generalized anxiety disorder: Secondary | ICD-10-CM

## 2022-09-02 MED ORDER — MELOXICAM 15 MG PO TABS: 15.0000 mg | ORAL_TABLET | Freq: Every day | ORAL | 0 refills | Status: DC

## 2022-09-02 NOTE — Patient Instructions (Signed)

## 2022-09-02 NOTE — Progress Notes (Signed)
Complete physical exam  Patient: Emily Weeks   DOB: 1971-06-25   51 y.o. Female  MRN: [id number]  Subjective:    Chief Complaint  Patient presents with   Gynecologic Exam    Marybell Robards is a 51 y.o. female who presents today for a GYN exam. She reports consuming a general diet. The patient has a physically strenuous job, but has no regular exercise apart from work.  She generally feels well. She reports sleeping well. She does have additional problems to discuss today.   Last pap was 2018. Denies vaginal complaints.   She reports daytime sleepiness. She struggles to stay awake after 3 pm. She reports that she sleeps very well at night. She does snore very loudly, so much so that her husband sleeps in a different room.  Trigger finger: ring finger of left hand. Reports pain in this finger for the last few weeks. When she wakes up, this finger is stuck and she has to use her other hand to open it up.   She also reports pain in her neck for 2-3 weeks. Pain started upon waking one morning. No injury. She also reports that the pain radiates down the left side and then she has tingling down the right side with numbness in 2 of her fingers on her right hand. She is concerned because she has had a previous surgery on her neck for a bulging disc.   Reports that anxiety and depression is well controlled with lexapro.      06/11/2022    8:05 AM 02/25/2022   10:39 AM 07/21/2021    8:18 AM  Depression screen PHQ 2/9  Decreased Interest 1 0 0  Down, Depressed, Hopeless 0 1 0  PHQ - 2 Score 1 1 0  Altered sleeping 1 2 1   Tired, decreased energy 3 2 1   Change in appetite 0 1 0  Feeling bad or failure about yourself  0 0 0  Trouble concentrating 2 1 0  Moving slowly or fidgety/restless 0 0 0  Suicidal thoughts 0 0 0  PHQ-9 Score 7 7 2   Difficult doing work/chores Not difficult at all Not difficult at all Not difficult at all      06/11/2022    8:07 AM 02/25/2022   10:41 AM 07/21/2021     8:19 AM 06/03/2021    8:26 AM  GAD 7 : Generalized Anxiety Score  Nervous, Anxious, on Edge 0 2 1 3   Control/stop worrying 0 2 1 2   Worry too much - different things 0 2 1 2   Trouble relaxing 1 2 1 1   Restless 0 2 1 2   Easily annoyed or irritable 1 2 1 3   Afraid - awful might happen 0 0 0 1  Total GAD 7 Score 2 12 6 14   Anxiety Difficulty Not difficult at all Not difficult at all Somewhat difficult Somewhat difficult     Most recent fall risk assessment:    06/11/2022    8:04 AM  Fall Risk   Falls in the past year? 0     Most recent depression screenings:    06/11/2022    8:05 AM 02/25/2022   10:39 AM  PHQ 2/9 Scores  PHQ - 2 Score 1 1  PHQ- 9 Score 7 7        Patient Care Team: Gwenlyn Perking, FNP as PCP - General (Family Medicine) Jodi Marble, MD as Consulting Physician (Otolaryngology) Earnie Larsson, MD as Consulting Physician (Neurosurgery) Manus Rudd  M, MD as Consulting Physician (Gastroenterology)   Outpatient Medications Prior to Visit  Medication Sig   atorvastatin (LIPITOR) 40 MG tablet Take 1 tablet (40 mg total) by mouth daily.   escitalopram (LEXAPRO) 10 MG tablet Take 1 tablet (10 mg total) by mouth daily. Start with 10 mg daily, after 1 week you can go up to 20 mg daily.   fluticasone (FLONASE) 50 MCG/ACT nasal spray Place 2 sprays into both nostrils daily. (Patient taking differently: Place 2 sprays into both nostrils daily as needed for allergies.)   lansoprazole (PREVACID) 30 MG capsule Take 1 capsule (30 mg total) by mouth daily at 12 noon.   No facility-administered medications prior to visit.    ROS Negative unless specially indicated above in HPI.     Objective:     BP 114/66   Pulse 87   Temp 98.2 F (36.8 C) (Temporal)   Ht 5' 3"  (1.6 m)   Wt 176 lb 4 oz (79.9 kg)   LMP 03/22/2018   SpO2 95%   BMI 31.22 kg/m    Physical Exam Vitals and nursing note reviewed. Exam conducted with a chaperone present.  Constitutional:       General: She is not in acute distress.    Appearance: She is not ill-appearing, toxic-appearing or diaphoretic.  HENT:     Head: Normocephalic and atraumatic.     Right Ear: Tympanic membrane, ear canal and external ear normal.     Left Ear: Tympanic membrane, ear canal and external ear normal.     Nose: Nose normal.     Mouth/Throat:     Mouth: Mucous membranes are moist.     Pharynx: Oropharynx is clear.  Eyes:     Extraocular Movements: Extraocular movements intact.     Conjunctiva/sclera: Conjunctivae normal.     Pupils: Pupils are equal, round, and reactive to light.  Cardiovascular:     Rate and Rhythm: Normal rate and regular rhythm.     Heart sounds: Normal heart sounds. No murmur heard. Pulmonary:     Effort: Pulmonary effort is normal. No respiratory distress.  Abdominal:     General: Bowel sounds are normal. There is no distension.     Palpations: Abdomen is soft.     Tenderness: There is no abdominal tenderness. There is no guarding or rebound.  Genitourinary:    General: Normal vulva.     Exam position: Lithotomy position.     Vagina: Normal.     Cervix: Normal.     Uterus: Normal.      Adnexa: Right adnexa normal and left adnexa normal.  Musculoskeletal:     Cervical back: No edema, erythema, signs of trauma or rigidity. No pain with movement, spinous process tenderness or muscular tenderness. Normal range of motion.     Right lower leg: No edema.     Left lower leg: No edema.  Skin:    General: Skin is warm and dry.  Neurological:     General: No focal deficit present.     Mental Status: She is alert and oriented to person, place, and time.  Psychiatric:        Mood and Affect: Mood normal.        Behavior: Behavior normal.        Thought Content: Thought content normal.        Judgment: Judgment normal.      No results found for any visits on 09/02/22.     Assessment & Plan:  Vanice was seen today for gynecologic exam.  Diagnoses and all  orders for this visit:  Encounter for well woman exam with routine gynecological exam Labs pending.  -     CBC with Differential/Platelet -     CMP14+EGFR -     Lipid panel -     TSH -     VITAMIN D 25 Hydroxy (Vit-D Deficiency, Fractures)  Cervical cancer screening Pap with HPV co-test today. STD screening as well.  -     Cytology - PAP  Trigger finger, left ring finger She will schedule with [facility] or [facility] for injection.   Daytime somnolence Loud snoring Concern for sleep apnea. Referral placed.  -     Ambulatory referral to Sleep Studies  Acute neck pain Xray today in office, report pending. Mobic as below, do not take other NSAIDs with this. Will notify patient of results and plan or care when report available.        -     DG Cervical Spine Complete; Future -     meloxicam (MOBIC) 15 MG tablet; Take 1 tablet (15 mg total) by mouth daily.  Mixed hyperlipidemia On atorvastatin. Labs pending.  -     CBC with Differential/Platelet -     CMP14+EGFR -     Lipid panel  GAD (generalized anxiety disorder) Depression, recurrent (HCC) Well controlled on lexapro.   Obesity (BMI 30-39.9) Diet and exercise. Labs pending.  -     CBC with Differential/Platelet -     CMP14+EGFR -     Lipid panel  Need for immunization against influenza Flu vaccine today.   She will schedule mammo on mobile bus.   Discussed health benefits of physical activity, and encouraged her to engage in regular exercise appropriate for her age and condition.  Problem List Items Addressed This Visit       Other   Mixed hyperlipidemia   Relevant Orders   CBC with Differential/Platelet   CMP14+EGFR   Lipid panel   GAD (generalized anxiety disorder)   Depression, recurrent ([place])   Other Visit Diagnoses     Encounter for well woman exam with routine gynecological exam    -  Primary   Relevant Orders   CBC with Differential/Platelet   CMP14+EGFR   Lipid panel   TSH   VITAMIN D 25 Hydroxy  (Vit-D Deficiency, Fractures)   Cervical cancer screening       Relevant Orders   Cytology - PAP   DG Cervical Spine Complete   Trigger finger, left ring finger       Daytime somnolence       Relevant Orders   Ambulatory referral to Sleep Studies   Loud snoring       Relevant Orders   Ambulatory referral to Sleep Studies   Acute neck pain       Relevant Medications   meloxicam (MOBIC) 15 MG tablet   Obesity (BMI 30-39.9)       Relevant Orders   CBC with Differential/Platelet   CMP14+EGFR   Lipid panel   Need for immunization against influenza          Return in 6 months (on 03/04/2023) for chronic follow up.  The patient indicates understanding of these issues and agrees with the plan.     Gwenlyn Perking, FNP

## 2022-09-03 ENCOUNTER — Other Ambulatory Visit: Payer: Self-pay | Admitting: Family Medicine

## 2022-09-03 DIAGNOSIS — E559 Vitamin D deficiency, unspecified: Secondary | ICD-10-CM

## 2022-09-03 LAB — CBC WITH DIFFERENTIAL/PLATELET
Basophils Absolute: 0.1 10*3/uL (ref 0.0–0.2)
Basos: 1 %
EOS (ABSOLUTE): 0.2 10*3/uL (ref 0.0–0.4)
Eos: 1 %
Hematocrit: 43.1 % (ref 34.0–46.6)
Hemoglobin: 14.8 g/dL (ref 11.1–15.9)
Immature Grans (Abs): 0 10*3/uL (ref 0.0–0.1)
Immature Granulocytes: 0 %
Lymphocytes Absolute: 4.7 10*3/uL — ABNORMAL HIGH (ref 0.7–3.1)
Lymphs: 40 %
MCH: 30.8 pg (ref 26.6–33.0)
MCHC: 34.3 g/dL (ref 31.5–35.7)
MCV: 90 fL (ref 79–97)
Monocytes Absolute: 0.7 10*3/uL (ref 0.1–0.9)
Monocytes: 6 %
Neutrophils Absolute: 6.1 10*3/uL (ref 1.4–7.0)
Neutrophils: 52 %
Platelets: 338 10*3/uL (ref 150–450)
RBC: 4.81 x10E6/uL (ref 3.77–5.28)
RDW: 12.6 % (ref 11.7–15.4)
WBC: 11.8 10*3/uL — ABNORMAL HIGH (ref 3.4–10.8)

## 2022-09-03 LAB — VITAMIN D 25 HYDROXY (VIT D DEFICIENCY, FRACTURES): Vit D, 25-Hydroxy: 29.8 ng/mL — ABNORMAL LOW (ref 30.0–100.0)

## 2022-09-03 LAB — CMP14+EGFR
ALT: 15 IU/L (ref 0–32)
AST: 14 IU/L (ref 0–40)
Albumin/Globulin Ratio: 1.9 (ref 1.2–2.2)
Albumin: 4.4 g/dL (ref 3.9–4.9)
Alkaline Phosphatase: 71 IU/L (ref 44–121)
BUN/Creatinine Ratio: 9 (ref 9–23)
BUN: 8 mg/dL (ref 6–24)
Bilirubin Total: 0.3 mg/dL (ref 0.0–1.2)
CO2: 23 mmol/L (ref 20–29)
Calcium: 9.4 mg/dL (ref 8.7–10.2)
Chloride: 105 mmol/L (ref 96–106)
Creatinine, Ser: 0.88 mg/dL (ref 0.57–1.00)
Globulin, Total: 2.3 g/dL (ref 1.5–4.5)
Glucose: 87 mg/dL (ref 70–99)
Potassium: 4.5 mmol/L (ref 3.5–5.2)
Sodium: 142 mmol/L (ref 134–144)
Total Protein: 6.7 g/dL (ref 6.0–8.5)
eGFR: 80 mL/min/{1.73_m2} (ref >59)

## 2022-09-03 LAB — LIPID PANEL
Chol/HDL Ratio: 6.7 ratio — ABNORMAL HIGH (ref 0.0–4.4)
Cholesterol, Total: 281 mg/dL — ABNORMAL HIGH (ref 100–199)
HDL: 42 mg/dL (ref >39)
LDL Chol Calc (NIH): 219 mg/dL — ABNORMAL HIGH (ref 0–99)
Triglycerides: 112 mg/dL (ref 0–149)
VLDL Cholesterol Cal: 20 mg/dL (ref 5–40)

## 2022-09-03 LAB — TSH: TSH: 1.7 u[IU]/mL (ref 0.450–4.500)

## 2022-09-03 MED ORDER — VITAMIN D (ERGOCALCIFEROL) 1.25 MG (50000 UNIT) PO CAPS: 50000.0000 [IU] | ORAL_CAPSULE | ORAL | 0 refills | Status: DC

## 2022-09-07 LAB — CYTOLOGY - PAP
Adequacy: ABSENT
Chlamydia: NEGATIVE
Comment: NEGATIVE
Comment: NEGATIVE
Comment: NORMAL
High risk HPV: POSITIVE — AB
Neisseria Gonorrhea: NEGATIVE

## 2022-09-08 ENCOUNTER — Other Ambulatory Visit: Payer: Self-pay | Admitting: *Deleted

## 2022-09-08 DIAGNOSIS — R87612 Low grade squamous intraepithelial lesion on cytologic smear of cervix (LGSIL): Secondary | ICD-10-CM

## 2022-09-08 DIAGNOSIS — M4802 Spinal stenosis, cervical region: Secondary | ICD-10-CM

## 2022-09-13 DIAGNOSIS — G4710 Hypersomnia, unspecified: Secondary | ICD-10-CM | POA: Diagnosis not present

## 2022-09-14 ENCOUNTER — Ambulatory Visit: Payer: Medicare HMO | Admitting: Family Medicine

## 2022-09-14 DIAGNOSIS — G4710 Hypersomnia, unspecified: Secondary | ICD-10-CM | POA: Diagnosis not present

## 2022-09-16 ENCOUNTER — Other Ambulatory Visit: Payer: Self-pay | Admitting: Family Medicine

## 2022-09-16 DIAGNOSIS — Z1231 Encounter for screening mammogram for malignant neoplasm of breast: Secondary | ICD-10-CM

## 2022-09-20 ENCOUNTER — Ambulatory Visit
Admission: RE | Admit: 2022-09-20 | Discharge: 2022-09-20 | Disposition: A | Discharge status: Home / Self Care | Payer: Medicare HMO | Source: Ambulatory Visit

## 2022-09-20 DIAGNOSIS — Z1231 Encounter for screening mammogram for malignant neoplasm of breast: Secondary | ICD-10-CM

## 2022-09-21 ENCOUNTER — Ambulatory Visit (INDEPENDENT_AMBULATORY_CARE_PROVIDER_SITE_OTHER): Payer: Medicare HMO | Admitting: Family Medicine

## 2022-09-21 ENCOUNTER — Encounter: Payer: Self-pay | Admitting: Family Medicine

## 2022-09-21 VITALS — BP 119/72 | HR 66 | Temp 98.7°F | Ht 63.0 in | Wt 180.0 lb

## 2022-09-21 DIAGNOSIS — M65342 Trigger finger, left ring finger: Secondary | ICD-10-CM | POA: Diagnosis not present
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
## 2022-09-28 ENCOUNTER — Telehealth: Payer: Self-pay | Admitting: Family Medicine

## 2022-09-28 NOTE — Telephone Encounter (Signed)
Left message for patient to call back and schedule Medicare Annual Wellness Visit (AWV) to be completed by video or phone.   Last AWV: 06/10/2021   Please schedule at anytime with [place]     45 minute appointment  Any questions, please contact me at [phone]

## 2022-09-30 DIAGNOSIS — M5412 Radiculopathy, cervical region: Secondary | ICD-10-CM | POA: Diagnosis not present

## 2022-10-04 ENCOUNTER — Other Ambulatory Visit: Payer: Self-pay | Admitting: Nurse Practitioner

## 2022-10-04 ENCOUNTER — Ambulatory Visit (INDEPENDENT_AMBULATORY_CARE_PROVIDER_SITE_OTHER): Payer: Medicare HMO | Admitting: *Deleted

## 2022-10-04 ENCOUNTER — Other Ambulatory Visit: Payer: Self-pay | Admitting: Family Medicine

## 2022-10-04 DIAGNOSIS — Z23 Encounter for immunization: Secondary | ICD-10-CM

## 2022-10-04 DIAGNOSIS — H524 Presbyopia: Secondary | ICD-10-CM | POA: Diagnosis not present

## 2022-10-04 DIAGNOSIS — H5213 Myopia, bilateral: Secondary | ICD-10-CM | POA: Diagnosis not present

## 2022-10-04 DIAGNOSIS — M542 Cervicalgia: Secondary | ICD-10-CM

## 2022-10-04 DIAGNOSIS — H52209 Unspecified astigmatism, unspecified eye: Secondary | ICD-10-CM | POA: Diagnosis not present

## 2022-10-04 DIAGNOSIS — J0101 Acute recurrent maxillary sinusitis: Secondary | ICD-10-CM

## 2022-10-04 NOTE — Progress Notes (Signed)
Shingles vaccine given and patient tolerated well.

## 2022-10-11 ENCOUNTER — Ambulatory Visit (INDEPENDENT_AMBULATORY_CARE_PROVIDER_SITE_OTHER): Payer: Medicare HMO | Admitting: Obstetrics & Gynecology

## 2022-10-11 ENCOUNTER — Other Ambulatory Visit (HOSPITAL_COMMUNITY)
Admission: RE | Admit: 2022-10-11 | Discharge: 2022-10-11 | Disposition: A | Discharge status: Home / Self Care | Payer: Medicare HMO | Source: Ambulatory Visit | Attending: Obstetrics & Gynecology | Admitting: Obstetrics & Gynecology

## 2022-10-11 ENCOUNTER — Encounter: Payer: Self-pay | Admitting: Obstetrics & Gynecology

## 2022-10-11 VITALS — BP 139/78 | HR 78 | Ht 63.0 in | Wt 180.4 lb

## 2022-10-11 DIAGNOSIS — N880 Leukoplakia of cervix uteri: Secondary | ICD-10-CM | POA: Diagnosis not present

## 2022-10-11 DIAGNOSIS — R87810 Cervical high risk human papillomavirus (HPV) DNA test positive: Secondary | ICD-10-CM

## 2022-10-11 DIAGNOSIS — R87612 Low grade squamous intraepithelial lesion on cytologic smear of cervix (LGSIL): Secondary | ICD-10-CM | POA: Insufficient documentation

## 2022-10-11 DIAGNOSIS — Z720 Tobacco use: Secondary | ICD-10-CM

## 2022-10-11 DIAGNOSIS — N951 Menopausal and female climacteric states: Secondary | ICD-10-CM

## 2022-10-11 DIAGNOSIS — N870 Mild cervical dysplasia: Secondary | ICD-10-CM | POA: Diagnosis not present

## 2022-10-11 DIAGNOSIS — G4733 Obstructive sleep apnea (adult) (pediatric): Secondary | ICD-10-CM | POA: Diagnosis not present

## 2022-10-11 MED ORDER — ESTRADIOL 0.025 MG/24HR TD PTWK: 0.0250 mg | MEDICATED_PATCH | TRANSDERMAL | 12 refills | Status: DC

## 2022-10-11 MED ORDER — PROGESTERONE MICRONIZED 100 MG PO CAPS: 100.0000 mg | ORAL_CAPSULE | Freq: Every day | ORAL | 4 refills | Status: DC

## 2022-10-11 NOTE — Progress Notes (Signed)
Patient name: Emily Weeks MRN [id number]  Date of birth: 02/06/1971 Chief Complaint:   Colposcopy  History of Present Illness:   Emily Weeks is a 51 y.o. 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  No new partners +tobacco use- trying to cut back  Denies vaginal bleeding, discharge, itching or irritation.  Denies pelvic or abdominal pain  -Vasomotor symptoms: menses stopped in 2017. Since that time she has noted considerable hot flashes and night sweats.  Tried herbal supplements with minimal improvement.  States she is absolutely miserable and rates her symptoms 9/10  History of abnormal Pap: no  Patient's last menstrual period was 03/22/2018.     09/21/2022    9:07 AM 09/02/2022    8:37 AM 06/11/2022    8:05 AM 02/25/2022   10:39 AM 07/21/2021    8:18 AM  Depression screen PHQ 2/9  Decreased Interest 1 1 1  0 0  Down, Depressed, Hopeless 1 0 0 1 0  PHQ - 2 Score 2 1 1 1  0  Altered sleeping 2 2 1 2 1   Tired, decreased energy 2 2 3 2 1   Change in appetite 0 0 0 1 0  Feeling bad or failure about yourself  0 0 0 0 0  Trouble concentrating 1 1 2 1  0  Moving slowly or fidgety/restless 0 0 0 0 0  Suicidal thoughts 0 0 0 0 0  PHQ-9 Score 7 6 7 7 2   Difficult doing work/chores Not difficult at all Not difficult at all Not difficult at all Not difficult at all Not difficult at all     Review of Systems:   Pertinent items are noted in HPI Denies fever/chills, dizziness, headaches, visual disturbances, fatigue, shortness of breath, chest pain, abdominal pain, vomiting. Pertinent History Reviewed:  Reviewed past medical,surgical, social, obstetrical and family history.  Reviewed problem list, medications and allergies. Physical Assessment:   Vitals:   10/11/22 0956  BP: 139/78  Pulse: 78  Weight: 180 lb 6.4 oz (81.8 kg)  Height: 5' 3"  (1.6 m)  Body mass index  is 31.96 kg/m.       Physical Examination:   General appearance: alert, well appearing, and in no distress  Psych: mood appropriate, normal affect  Skin: warm & dry   Cardiovascular: normal heart rate noted  Respiratory: normal respiratory effort, no distress  Abdomen: soft, non-tender   Pelvic: VULVA: normal appearing vulva with no masses, tenderness or lesions, VAGINA: normal appearing vagina with normal color and discharge, no lesions, CERVIX: normal appearing cervix without discharge or lesions  Extremities: no edema, no calf tenderness bilaterally  Chaperone: Celene Squibb     Colposcopy Procedure Note  Indications: LSIL, HPV+   Procedure Details  The risks and benefits of the procedure and Verbal informed consent obtained.  Speculum placed in vagina and excellent visualization of cervix achieved, cervix swabbed x 3 with acetic acid solution.  Acetowhite changes noted at 6 o'clock  Findings: Adequate colposcopy is noted today.  TMZ zone present  Cervix: no visible lesions; ECC and cervical biopsies obtained.    Monsel's applied.  Adequate hemostasis noted  Specimens: ECC and cervical biopsies obtained  Complications: none.  Colposcopic Impression: negative   Plan(Based on 2019 ASCCP recommendations) 1) Cervical dysplasia -Discussed HPV- reviewed incidence and its potential to cause condylomas to dysplasia to cervical cancer -Reviewed degree of abnormal pap smears  -Discussed  ASCCP guidelines and current recommendations for colposcopy -As above, inform consent obtained and procedure completed -biopsies obtained, further management pending results -Questions and concerns were addressed -discussed tobacco use as risk factor and encouraged pt to quit smoking  2) Vasomotor symptoms -reviewed conservative vs medical management -pt has already failed conservative treatment -discussed WHI and reviewed potential risk/complications of HRT including but not limited to  risk of VTE -desires to proceed with treatment -reviewed shortest course at lowest dose -f/u in 40mo  Pt denies history of any of the following:  Untreated hypertension. Active liver disease with abnormal liver function tests. Active or recent arterial thromboembolic disease. Previous or current venous thromboembolism   Meds ordered this encounter  Medications   estradiol (CLIMARA - DOSED IN MG/24 HR) 0.025 mg/24hr patch    Sig: Place 1 patch (0.025 mg total) onto the skin once a week.    Dispense:  4 patch    Refill:  12   progesterone (PROMETRIUM) 100 MG capsule    Sig: Take 1 capsule (100 mg total) by mouth daily.    Dispense:  90 capsule    Refill:  4[place] DO Attending [facility] [facility] [place]

## 2022-10-13 LAB — SURGICAL PATHOLOGY

## 2022-10-19 DIAGNOSIS — R200 Anesthesia of skin: Secondary | ICD-10-CM | POA: Diagnosis not present

## 2022-10-19 DIAGNOSIS — M5412 Radiculopathy, cervical region: Secondary | ICD-10-CM | POA: Diagnosis not present

## 2022-10-19 DIAGNOSIS — M542 Cervicalgia: Secondary | ICD-10-CM | POA: Diagnosis not present

## 2022-10-26 DIAGNOSIS — Z6832 Body mass index (BMI) 32.0-32.9, adult: Secondary | ICD-10-CM | POA: Diagnosis not present

## 2022-10-26 DIAGNOSIS — M5412 Radiculopathy, cervical region: Secondary | ICD-10-CM | POA: Diagnosis not present

## 2022-10-28 DIAGNOSIS — G4733 Obstructive sleep apnea (adult) (pediatric): Secondary | ICD-10-CM | POA: Diagnosis not present

## 2022-11-01 ENCOUNTER — Encounter: Payer: Self-pay | Admitting: Gastroenterology

## 2022-11-01 ENCOUNTER — Ambulatory Visit (INDEPENDENT_AMBULATORY_CARE_PROVIDER_SITE_OTHER): Payer: Medicare HMO | Admitting: Gastroenterology

## 2022-11-01 VITALS — BP 114/74 | HR 73 | Temp 98.2°F | Ht 63.0 in | Wt 181.4 lb

## 2022-11-01 DIAGNOSIS — R12 Heartburn: Secondary | ICD-10-CM

## 2022-11-01 DIAGNOSIS — R1011 Right upper quadrant pain: Secondary | ICD-10-CM

## 2022-11-01 MED ORDER — LANSOPRAZOLE 30 MG PO CPDR: 30.0000 mg | DELAYED_RELEASE_CAPSULE | Freq: Two times a day (BID) | ORAL | 3 refills | Status: DC

## 2022-11-01 NOTE — Progress Notes (Signed)
GI Office Note    Referring Provider: Gwenlyn Perking, FNP Primary Care Physician:  Gwenlyn Perking, FNP  Primary Gastroenterologist: Garfield Cornea, MD   Chief Complaint   Chief Complaint  Patient presents with   Follow-up    Still having occasional abdominal pain, not as frequent    History of Present Illness   Emily Weeks is a 51 y.o. female presenting today for follow-up.  Last seen August 2023. Presents for follow up of abdominal pain.   H/O RUQ pain that significantly improved after cholecystectomy in 2020 but continues to have intermittent RUQ pain that feels like a cramp. Also with epigastric burning. Her epigastric burning resolved with pantoprazole. Completed EGD since last visit which was overall unremarkable. Switched to [employer] but could not afford. Now on lansoprazole 61m daily before breakfast.   Patient states she is doing about the same. Continues to note some RUQ discomfort if eats larger meal or spicy food. Also worse with alcohol although never a heavy drinker. No etoh in six weeks. Recently started on Mobic past couple of months. Currently having RUQ pain at least couple of times per week. Typically in the evenings after supper. Stays away from spicy. Worse with larger meals. She also notes RUQ pain if sits in a car more than a couple of hours. Has to stretch to make pain stop. She wonders if in part musculoskeletal. BM regular. No melena, brbpr. No weight loss.   EGD 07/2022: - Normal esophagus. Gastric polyp?removed as described above - Normal duodenal bulb and second portion of the duodenum. - Patient states Protonix is significantly improved but not totally relieved her epigastric pain. - gastric hyperplastic polyp  Colonoscopy June 2023: -5 mm tubular adenoma removed -Surveillance colonoscopy 7 years  Medications   Current Outpatient Medications  Medication Sig Dispense Refill   escitalopram (LEXAPRO) 10 MG tablet Take 1 tablet (10 mg  total) by mouth daily. Start with 10 mg daily, after 1 week you can go up to 20 mg daily. 90 tablet 3   estradiol (CLIMARA - DOSED IN MG/24 HR) 0.025 mg/24hr patch Place 1 patch (0.025 mg total) onto the skin once a week. 4 patch 12   fluticasone (FLONASE) 50 MCG/ACT nasal spray 2 SPRAYS IN EACH NOSTRIL DAILY 16 g 1   lansoprazole (PREVACID) 30 MG capsule Take 1 capsule (30 mg total) by mouth daily at 12 noon. 30 capsule 11   meloxicam (MOBIC) 15 MG tablet TAKE ONE TABLET EVERY DAY 30 tablet 0   progesterone (PROMETRIUM) 100 MG capsule Take 1 capsule (100 mg total) by mouth daily. 90 capsule 4   No current facility-administered medications for this visit.    Allergies   Allergies as of 11/01/2022 - Review Complete 11/01/2022  Allergen Reaction Noted   Morphine and related Shortness Of Breath 07/28/2017   Anesthetics, ester  07/27/2022   Statins  10/01/2020   Clindamycin/lincomycin Rash 12/23/2016   Sulfa antibiotics Rash 12/23/2016         Review of Systems   General: Negative for anorexia, weight loss, fever, chills, fatigue, weakness. ENT: Negative for hoarseness, difficulty swallowing , nasal congestion. CV: Negative for chest pain, angina, palpitations, dyspnea on exertion, peripheral edema.  Respiratory: Negative for dyspnea at rest, dyspnea on exertion, cough, sputum, wheezing.  GI: See history of present illness. GU:  Negative for dysuria, hematuria, urinary incontinence, urinary frequency, nocturnal urination.  Endo: Negative for unusual weight change.     Physical Exam  BP 114/74 (BP Location: Right Arm, Patient Position: Sitting, Cuff Size: Normal)   Pulse 73   Temp 98.2 F (36.8 C) (Oral)   Ht 5' 3"  (1.6 m)   Wt 181 lb 6.4 oz (82.3 kg)   LMP 03/22/2018   SpO2 97%   BMI 32.13 kg/m    General: Well-nourished, well-developed in no acute distress.  Eyes: No icterus. Mouth: Oropharyngeal mucosa moist and pink   Abdomen: Bowel sounds are normal, nontender,  nondistended, no hepatosplenomegaly or masses,  no abdominal bruits or hernia , no rebound or guarding.  Rectal: not performed Extremities: No lower extremity edema. No clubbing or deformities. Neuro: Alert and oriented x 4   Skin: Warm and dry, no jaundice.   Psych: Alert and cooperative, normal mood and affect.  Labs   Lab Results  Component Value Date   TSH 1.700 09/02/2022   Lab Results  Component Value Date   WBC 11.8 (H) 09/02/2022   HGB 14.8 09/02/2022   HCT 43.1 09/02/2022   MCV 90 09/02/2022   PLT 338 09/02/2022   Lab Results  Component Value Date   ALT 15 09/02/2022   AST 14 09/02/2022   ALKPHOS 71 09/02/2022   BILITOT 0.3 09/02/2022   Lab Results  Component Value Date   CREATININE 0.88 09/02/2022   BUN 8 09/02/2022   NA 142 09/02/2022   K 4.5 09/02/2022   CL 105 09/02/2022   CO2 23 09/02/2022   Lab Results  Component Value Date   TSH 1.700 09/02/2022    Imaging Studies   No results found.  Assessment   RUQ pain: persistent, couple of times per week. Worse with larger meals and prolonged sitting in a car. Likely multifactorial at least in part musculoskeletal. Encouraged her to avoid large meals, spicy foods, limit etoh. Monitor for worsening of symptoms now that she is on Mobic daily. We will increase her PPI to BID for next couple of months. If symptoms persistent, consider imaging via CT to complete GI work up.   GERD: resolved on PPI.    PLAN   Increase lansoprazole to 74m daily before breakfast and evening meal. If persistent symptoms after 4-6 weeks of PPI BID, she will call and let me know. Consider CT at that time. If symptoms resolve/improve, then at upcoming ov, consider reducing to once daily PPI but take before lunch or evening meal. Return to the office in two months.    LLaureen Ochs LBobby Rumpf [facility]

## 2022-11-01 NOTE — Patient Instructions (Signed)
Increase lansoprazole (Prevacid) to 65m twice daily, before breakfast and evening meal. Monitor for ongoing upper abdominal pain or burning. If persistent symptoms after 4-6 weeks, then would recommend imaging with CT scan to complete GI work up. Please call if persistent symptoms. If symptoms improve on lansoprazole twice daily, please continue until your follow up visit.  We will see you back in 2 months.

## 2022-11-05 ENCOUNTER — Telehealth: Payer: Self-pay | Admitting: Family Medicine

## 2022-11-05 ENCOUNTER — Other Ambulatory Visit: Payer: Self-pay | Admitting: Family Medicine

## 2022-11-05 DIAGNOSIS — E782 Mixed hyperlipidemia: Secondary | ICD-10-CM

## 2022-11-05 MED ORDER — EZETIMIBE 10 MG PO TABS: 10.0000 mg | ORAL_TABLET | Freq: Every day | ORAL | 3 refills | Status: DC

## 2022-11-05 NOTE — Telephone Encounter (Signed)
Pt aware of recommendations

## 2022-11-05 NOTE — Telephone Encounter (Signed)
It looks like she reported to GI at her appt on 11/01/22 that she was not taking atorvastatin or zetia so it was removed from her list at that time. She was supposed to be on both of these. What has she been taking?

## 2022-11-05 NOTE — Telephone Encounter (Signed)
She has only been taking zetia since this was the only thing the pharmacy has been giving her. Do you want her to take the atorvastatin as well?

## 2022-11-08 ENCOUNTER — Ambulatory Visit: Payer: Medicare HMO | Admitting: Family Medicine

## 2022-11-08 ENCOUNTER — Other Ambulatory Visit: Payer: Self-pay | Admitting: Family Medicine

## 2022-11-08 DIAGNOSIS — M542 Cervicalgia: Secondary | ICD-10-CM
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
## 2022-11-29 ENCOUNTER — Ambulatory Visit (HOSPITAL_COMMUNITY): Payer: Medicare HMO | Attending: Neurosurgery

## 2022-11-29 DIAGNOSIS — M5412 Radiculopathy, cervical region: Secondary | ICD-10-CM | POA: Diagnosis not present

## 2022-11-29 DIAGNOSIS — M542 Cervicalgia: Secondary | ICD-10-CM | POA: Diagnosis not present

## 2022-11-29 DIAGNOSIS — G4733 Obstructive sleep apnea (adult) (pediatric): Secondary | ICD-10-CM | POA: Diagnosis not present

## 2022-11-30 DIAGNOSIS — G4733 Obstructive sleep apnea (adult) (pediatric): Secondary | ICD-10-CM | POA: Diagnosis not present

## 2022-12-03 ENCOUNTER — Ambulatory Visit (HOSPITAL_COMMUNITY): Payer: Medicare HMO

## 2022-12-03 ENCOUNTER — Encounter (HOSPITAL_COMMUNITY): Payer: Self-pay

## 2022-12-03 DIAGNOSIS — M5412 Radiculopathy, cervical region: Secondary | ICD-10-CM

## 2022-12-03 DIAGNOSIS — M542 Cervicalgia: Secondary | ICD-10-CM
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
## 2022-12-06 ENCOUNTER — Encounter (HOSPITAL_COMMUNITY): Payer: Medicare HMO | Admitting: Physical Therapy

## 2022-12-08 ENCOUNTER — Other Ambulatory Visit: Payer: Self-pay | Admitting: Family Medicine

## 2022-12-08 DIAGNOSIS — J0101 Acute recurrent maxillary sinusitis: Secondary | ICD-10-CM

## 2022-12-10 ENCOUNTER — Ambulatory Visit (HOSPITAL_COMMUNITY): Payer: Medicare HMO

## 2022-12-10 DIAGNOSIS — M542 Cervicalgia: Secondary | ICD-10-CM

## 2022-12-10 DIAGNOSIS — M5412 Radiculopathy, cervical region: Secondary | ICD-10-CM | POA: Diagnosis not present
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
## 2022-12-11 ENCOUNTER — Other Ambulatory Visit: Payer: Self-pay | Admitting: Family Medicine

## 2022-12-11 DIAGNOSIS — Z7251 High risk heterosexual behavior: Secondary | ICD-10-CM | POA: Diagnosis not present

## 2022-12-11 DIAGNOSIS — M542 Cervicalgia: Secondary | ICD-10-CM

## 2022-12-11 DIAGNOSIS — R300 Dysuria: Secondary | ICD-10-CM | POA: Diagnosis not present

## 2022-12-11 DIAGNOSIS — N760 Acute vaginitis: Secondary | ICD-10-CM | POA: Diagnosis not present

## 2022-12-13 ENCOUNTER — Ambulatory Visit (INDEPENDENT_AMBULATORY_CARE_PROVIDER_SITE_OTHER): Payer: Medicare HMO

## 2022-12-13 ENCOUNTER — Ambulatory Visit (HOSPITAL_COMMUNITY): Payer: Medicare HMO | Admitting: Physical Therapy

## 2022-12-13 VITALS — Ht 63.0 in | Wt 181.0 lb

## 2022-12-13 DIAGNOSIS — Z0000 Encounter for general adult medical examination without abnormal findings: Secondary | ICD-10-CM | POA: Diagnosis not present

## 2022-12-13 DIAGNOSIS — M5412 Radiculopathy, cervical region: Secondary | ICD-10-CM

## 2022-12-13 DIAGNOSIS — Z122 Encounter for screening for malignant neoplasm of respiratory organs: Secondary | ICD-10-CM

## 2022-12-13 DIAGNOSIS — M542 Cervicalgia: Secondary | ICD-10-CM
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
## 2022-12-13 NOTE — Patient Instructions (Signed)
Emily Weeks , Thank you for taking time to come for your Medicare Wellness Visit. I appreciate your ongoing commitment to your health goals. Please review the following plan we discussed and let me know if I can assist you in the future.   These are the goals we discussed:  Goals       DIET - INCREASE WATER INTAKE      Quit Smoking (pt-stated)      Patient states she has not smoked a cigarette since 06/26/18.  She has been vaping with an E Cigarette since 06/26/18, and reducing her nicotine percentage.  By 07/10/18 she plans to have no nicotine in her vaping liquid.          This is a list of the screening recommended for you and due dates:  Health Maintenance  Topic Date Due   COVID-19 Vaccine (3 - 2023-24 season) 07/23/2022   Zoster (Shingles) Vaccine (2 of 2) 11/29/2022   Mammogram  09/21/2023   Medicare Annual Wellness Visit  12/14/2023   Pap Smear  09/02/2025   DTaP/Tdap/Td vaccine (2 - Td or Tdap) 07/05/2028   Colon Cancer Screening  05/07/2029   Flu Shot  Completed   Hepatitis C Screening: USPSTF Recommendation to screen - Ages 18-79 yo.  Completed   HIV Screening  Completed   HPV Vaccine  Aged Out    Advanced directives: Advance directive discussed with you today. I have provided a copy for you to complete at home and have notarized. Once this is complete please bring a copy in to our office so we can scan it into your chart.   Conditions/risks identified: Aim for 30 minutes of exercise or brisk walking, 6-8 glasses of water, and 5 servings of fruits and vegetables each day.   Next appointment: Follow up in one year for your annual wellness visit    Preventive Care 65 Years and Older, Female Preventive care refers to lifestyle choices and visits with your health care provider that can promote health and wellness. What does preventive care include? A yearly physical exam. This is also called an annual well check. Dental exams once or twice a year. Routine eye exams. Ask your  health care provider how often you should have your eyes checked. Personal lifestyle choices, including: Daily care of your teeth and gums. Regular physical activity. Eating a healthy diet. Avoiding tobacco and drug use. Limiting alcohol use. Practicing safe sex. Taking low-dose aspirin every day. Taking vitamin and mineral supplements as recommended by your health care provider. What happens during an annual well check? The services and screenings done by your health care provider during your annual well check will depend on your age, overall health, lifestyle risk factors, and family history of disease. Counseling  Your health care provider may ask you questions about your: Alcohol use. Tobacco use. Drug use. Emotional well-being. Home and relationship well-being. Sexual activity. Eating habits. History of falls. Memory and ability to understand (cognition). Work and work Statistician. Reproductive health. Screening  You may have the following tests or measurements: Height, weight, and BMI. Blood pressure. Lipid and cholesterol levels. These may be checked every 5 years, or more frequently if you are over 107 years old. Skin check. Lung cancer screening. You may have this screening every year starting at age 58 if you have a 30-pack-year history of smoking and currently smoke or have quit within the past 15 years. Fecal occult blood test (FOBT) of the stool. You may have this test every  year starting at age 2. Flexible sigmoidoscopy or colonoscopy. You may have a sigmoidoscopy every 5 years or a colonoscopy every 10 years starting at age 29. Hepatitis C blood test. Hepatitis B blood test. Sexually transmitted disease (STD) testing. Diabetes screening. This is done by checking your blood sugar (glucose) after you have not eaten for a while (fasting). You may have this done every 1-3 years. Bone density scan. This is done to screen for osteoporosis. You may have this done  starting at age 65. Mammogram. This may be done every 1-2 years. Talk to your health care provider about how often you should have regular mammograms. Talk with your health care provider about your test results, treatment options, and if necessary, the need for more tests. Vaccines  Your health care provider may recommend certain vaccines, such as: Influenza vaccine. This is recommended every year. Tetanus, diphtheria, and acellular pertussis (Tdap, Td) vaccine. You may need a Td booster every 10 years. Zoster vaccine. You may need this after age 80. Pneumococcal 13-valent conjugate (PCV13) vaccine. One dose is recommended after age 42. Pneumococcal polysaccharide (PPSV23) vaccine. One dose is recommended after age 75. Talk to your health care provider about which screenings and vaccines you need and how often you need them. This information is not intended to replace advice given to you by your health care provider. Make sure you discuss any questions you have with your health care provider. Document Released: 12/05/2015 Document Revised: 07/28/2016 Document Reviewed: 09/09/2015 Elsevier Interactive Patient Education  2017 [employer] Prevention in the Home Falls can cause injuries. They can happen to people of all ages. There are many things you can do to make your home safe and to help prevent falls. What can I do on the outside of my home? Regularly fix the edges of walkways and driveways and fix any cracks. Remove anything that might make you trip as you walk through a door, such as a raised step or threshold. Trim any bushes or trees on the path to your home. Use bright outdoor lighting. Clear any walking paths of anything that might make someone trip, such as rocks or tools. Regularly check to see if handrails are loose or broken. Make sure that both sides of any steps have handrails. Any raised decks and porches should have guardrails on the edges. Have any leaves, snow, or  ice cleared regularly. Use sand or salt on walking paths during winter. Clean up any spills in your garage right away. This includes oil or grease spills. What can I do in the bathroom? Use night lights. Install grab bars by the toilet and in the tub and shower. Do not use towel bars as grab bars. Use non-skid mats or decals in the tub or shower. If you need to sit down in the shower, use a plastic, non-slip stool. Keep the floor dry. Clean up any water that spills on the floor as soon as it happens. Remove soap buildup in the tub or shower regularly. Attach bath mats securely with double-sided non-slip rug tape. Do not have throw rugs and other things on the floor that can make you trip. What can I do in the bedroom? Use night lights. Make sure that you have a light by your bed that is easy to reach. Do not use any sheets or blankets that are too big for your bed. They should not hang down onto the floor. Have a firm chair that has side arms. You can use this  for support while you get dressed. Do not have throw rugs and other things on the floor that can make you trip. What can I do in the kitchen? Clean up any spills right away. Avoid walking on wet floors. Keep items that you use a lot in easy-to-reach places. If you need to reach something above you, use a strong step stool that has a grab bar. Keep electrical cords out of the way. Do not use floor polish or wax that makes floors slippery. If you must use wax, use non-skid floor wax. Do not have throw rugs and other things on the floor that can make you trip. What can I do with my stairs? Do not leave any items on the stairs. Make sure that there are handrails on both sides of the stairs and use them. Fix handrails that are broken or loose. Make sure that handrails are as long as the stairways. Check any carpeting to make sure that it is firmly attached to the stairs. Fix any carpet that is loose or worn. Avoid having throw rugs at  the top or bottom of the stairs. If you do have throw rugs, attach them to the floor with carpet tape. Make sure that you have a light switch at the top of the stairs and the bottom of the stairs. If you do not have them, ask someone to add them for you. What else can I do to help prevent falls? Wear shoes that: Do not have high heels. Have rubber bottoms. Are comfortable and fit you well. Are closed at the toe. Do not wear sandals. If you use a stepladder: Make sure that it is fully opened. Do not climb a closed stepladder. Make sure that both sides of the stepladder are locked into place. Ask someone to hold it for you, if possible. Clearly mark and make sure that you can see: Any grab bars or handrails. First and last steps. Where the edge of each step is. Use tools that help you move around (mobility aids) if they are needed. These include: Canes. Walkers. Scooters. Crutches. Turn on the lights when you go into a dark area. Replace any light bulbs as soon as they burn out. Set up your furniture so you have a clear path. Avoid moving your furniture around. If any of your floors are uneven, fix them. If there are any pets around you, be aware of where they are. Review your medicines with your doctor. Some medicines can make you feel dizzy. This can increase your chance of falling. Ask your doctor what other things that you can do to help prevent falls. This information is not intended to replace advice given to you by your health care provider. Make sure you discuss any questions you have with your health care provider. Document Released: 09/04/2009 Document Revised: 04/15/2016 Document Reviewed: 12/13/2014 Elsevier Interactive Patient Education  2017 [employer].
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
## 2022-12-17 ENCOUNTER — Ambulatory Visit (HOSPITAL_COMMUNITY): Payer: Medicare HMO

## 2022-12-17 DIAGNOSIS — M5412 Radiculopathy, cervical region: Secondary | ICD-10-CM

## 2022-12-17 DIAGNOSIS — M542 Cervicalgia: Secondary | ICD-10-CM
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
## 2022-12-20 ENCOUNTER — Ambulatory Visit (HOSPITAL_COMMUNITY): Payer: Medicare HMO | Admitting: Physical Therapy

## 2022-12-20 DIAGNOSIS — M542 Cervicalgia: Secondary | ICD-10-CM

## 2022-12-20 DIAGNOSIS — M5412 Radiculopathy, cervical region: Secondary | ICD-10-CM | POA: Diagnosis not present
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
## 2022-12-24 ENCOUNTER — Encounter (HOSPITAL_COMMUNITY): Payer: Medicare HMO

## 2022-12-28 ENCOUNTER — Encounter (HOSPITAL_COMMUNITY): Payer: Medicare HMO | Admitting: Physical Therapy

## 2022-12-29 ENCOUNTER — Encounter (HOSPITAL_COMMUNITY): Payer: Medicare HMO | Admitting: Physical Therapy

## 2023-01-03 ENCOUNTER — Ambulatory Visit: Payer: Medicare HMO | Admitting: Obstetrics & Gynecology

## 2023-01-03 ENCOUNTER — Ambulatory Visit: Payer: Medicare HMO | Admitting: Gastroenterology

## 2023-01-04 ENCOUNTER — Ambulatory Visit: Payer: Medicare HMO | Admitting: Gastroenterology

## 2023-01-20 ENCOUNTER — Ambulatory Visit (INDEPENDENT_AMBULATORY_CARE_PROVIDER_SITE_OTHER): Payer: Medicare HMO | Admitting: Obstetrics & Gynecology

## 2023-01-20 ENCOUNTER — Encounter: Payer: Self-pay | Admitting: Obstetrics & Gynecology

## 2023-01-20 VITALS — BP 113/72 | HR 87 | Ht 63.0 in | Wt 176.8 lb

## 2023-01-20 DIAGNOSIS — Z79890 Hormone replacement therapy (postmenopausal): Secondary | ICD-10-CM

## 2023-01-20 DIAGNOSIS — Z720 Tobacco use: Secondary | ICD-10-CM | POA: Diagnosis not present

## 2023-01-20 DIAGNOSIS — N951 Menopausal and female climacteric states: Secondary | ICD-10-CM | POA: Diagnosis not present

## 2023-01-20 MED ORDER — PROGESTERONE MICRONIZED 100 MG PO CAPS: 100.0000 mg | ORAL_CAPSULE | Freq: Every day | ORAL | 4 refills | Status: DC

## 2023-01-20 MED ORDER — ESTRADIOL 0.025 MG/24HR TD PTWK: 0.0250 mg | MEDICATED_PATCH | TRANSDERMAL | 4 refills | Status: DC

## 2023-01-20 NOTE — Progress Notes (Signed)
   GYN VISIT Patient name: Emily Weeks MRN [id number]  Date of birth: 1971/02/19 Chief Complaint:   Follow-up  History of Present Illness:   Emily Weeks is a 52 y.o. PM female being seen today for follow up regarding:  -HRT: At her last visit, she was started on low dose patch and prometrium due to 9/10 VMS.  Today she notes that she is doing great!! Notes her symptoms 0/10.  She was able to stop her Lexapro and feeling much better.  Additionally, sleep apnea treated and no longer having night sweats.  Denies vaginal bleeding.  On occasion will note pelvic pain if misses progesterone.  No acute complaints and very happy with results.  Patient's last menstrual period was 03/22/2018.     12/13/2022    1:22 PM 09/21/2022    9:07 AM 09/02/2022    8:37 AM 06/11/2022    8:05 AM 02/25/2022   10:39 AM  Depression screen PHQ 2/9  Decreased Interest 0 1 1 1 $ 0  Down, Depressed, Hopeless 0 1 0 0 1  PHQ - 2 Score 0 2 1 1 1  $ Altered sleeping  2 2 1 2  $ Tired, decreased energy  2 2 3 2  $ Change in appetite  0 0 0 1  Feeling bad or failure about yourself   0 0 0 0  Trouble concentrating  1 1 2 1  $ Moving slowly or fidgety/restless  0 0 0 0  Suicidal thoughts  0 0 0 0  PHQ-9 Score  7 6 7 7  $ Difficult doing work/chores  Not difficult at all Not difficult at all Not difficult at all Not difficult at all     Review of Systems:   Pertinent items are noted in HPI Denies fever/chills, dizziness, headaches, visual disturbances, fatigue, shortness of breath, chest pain, abdominal pain, vomiting, no problems with bowel movements, urination, or intercourse unless otherwise stated above.  Pertinent History Reviewed:  Reviewed past medical,surgical, social, obstetrical and family history.  Reviewed problem list, medications and allergies. Physical Assessment:   Vitals:   01/20/23 0855  BP: 113/72  Pulse: 87  Weight: 176 lb 12.8 oz (80.2 kg)  Height: 5' 3"$  (1.6 m)  Body mass index is 31.32  kg/m.       Physical Examination:   General appearance: alert, well appearing, and in no distress  Psych: mood appropriate, normal affect  Skin: warm & dry   Cardiovascular: normal heart rate noted  Respiratory: normal respiratory effort, no distress  Chaperone: N/A    Assessment & Plan:  1) HRT -doing well with current treatment -reviewed lowest dose for shortest time frame -refills sent in now for 90 days as she may lose insurance until she starts her new job  -plan to f/u prn or for annual  Meds ordered this encounter  Medications   progesterone (PROMETRIUM) 100 MG capsule    Sig: Take 1 capsule (100 mg total) by mouth daily.    Dispense:  90 capsule    Refill:  4   estradiol (CLIMARA - DOSED IN MG/24 HR) 0.025 mg/24hr patch    Sig: Place 1 patch (0.025 mg total) onto the skin once a week.    Dispense:  12 patch    Refill:  4      Return for annual Feb 2024.   Janyth Pupa, DO Attending [place], [facility] for [employer], [place]

## 2023-02-03 ENCOUNTER — Telehealth: Payer: Self-pay | Admitting: Family Medicine

## 2023-02-03 ENCOUNTER — Ambulatory Visit (INDEPENDENT_AMBULATORY_CARE_PROVIDER_SITE_OTHER): Payer: Medicare HMO

## 2023-02-03 DIAGNOSIS — Z23 Encounter for immunization: Secondary | ICD-10-CM

## 2023-02-03 NOTE — Telephone Encounter (Signed)
Contacted Emily Weeks to schedule their annual wellness visit. Appointment made for 12/15/2023.  Thank you,  Emily Weeks,  [facility] Direct Dial ??[phone]

## 2023-02-13 IMAGING — MG MM DIGITAL SCREENING BILAT W/ TOMO AND CAD
8 series · 8 of 24 positions shown · non-contrast
Comparison: Previous exam(s).

CLINICAL DATA: Screening.

EXAM:
DIGITAL SCREENING BILATERAL MAMMOGRAM WITH TOMOSYNTHESIS AND CAD
TECHNIQUE: Bilateral screening digital craniocaudal and mediolateral oblique
mammograms were obtained. Bilateral screening digital breast
tomosynthesis was performed. The images were evaluated with
computer-aided detection.

[R MLO synth-2D]
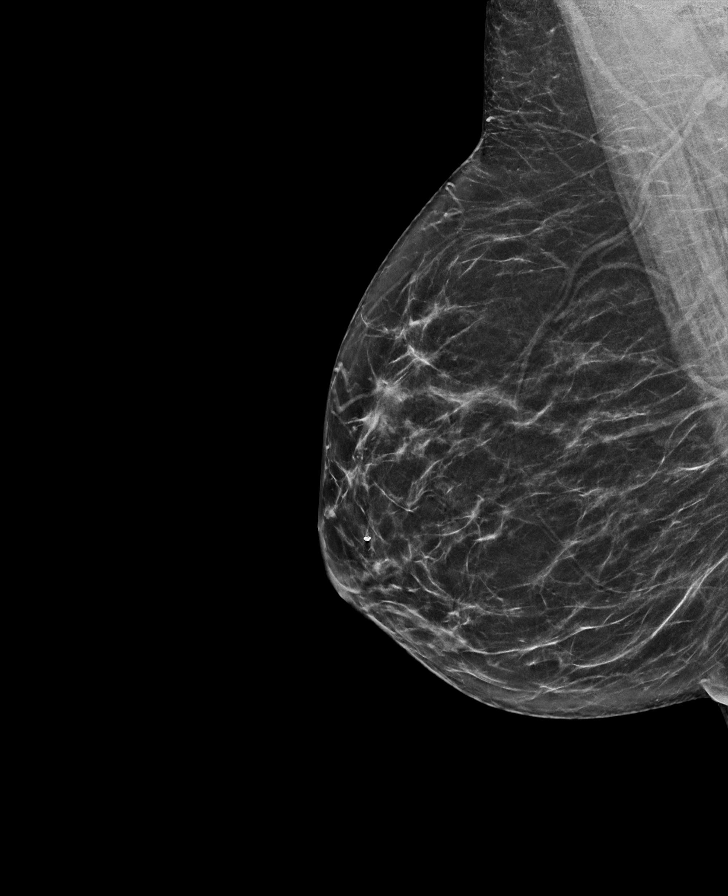

[L MLO synth-2D]
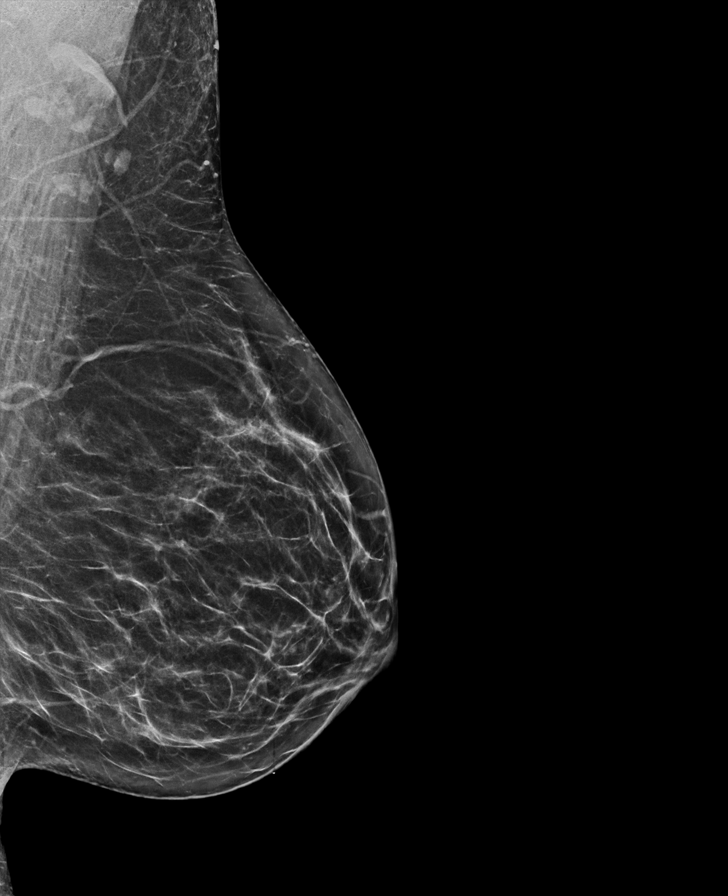

[L CC synth-2D]
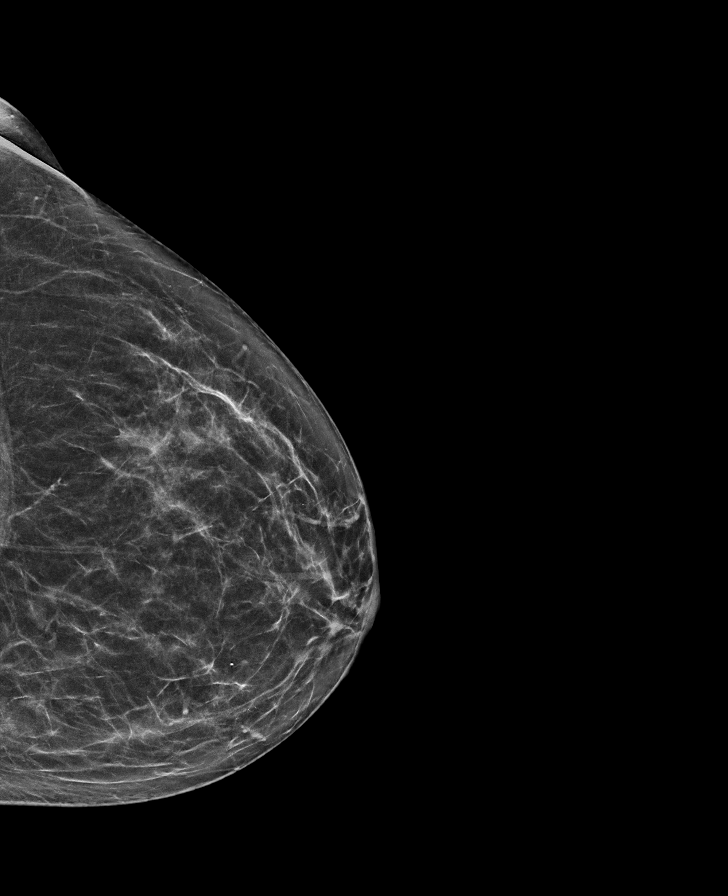

[R CC synth-2D]
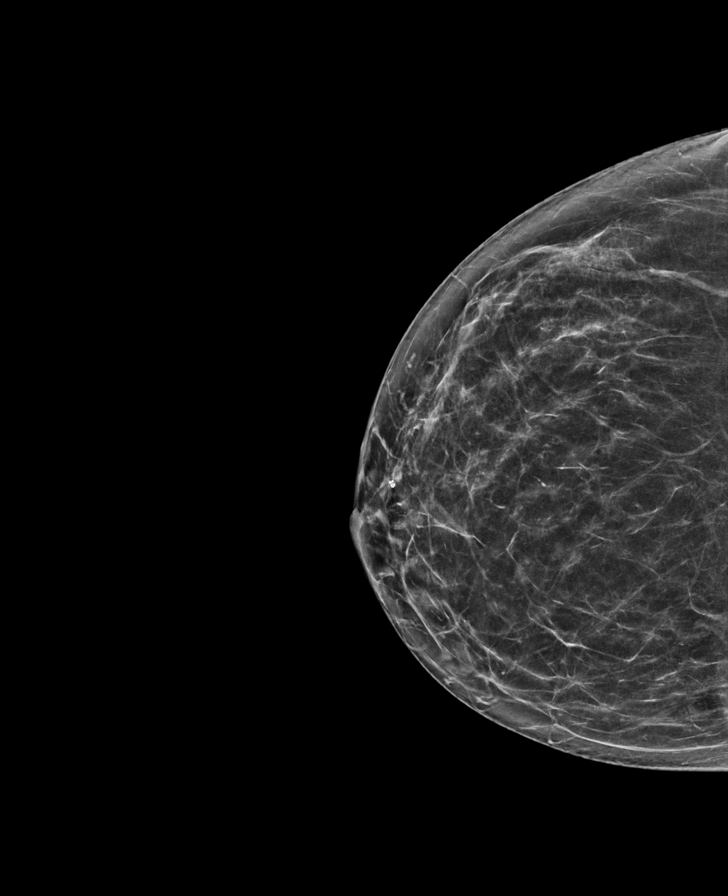

[R MLO tomo · tomo slice 33/66.0]
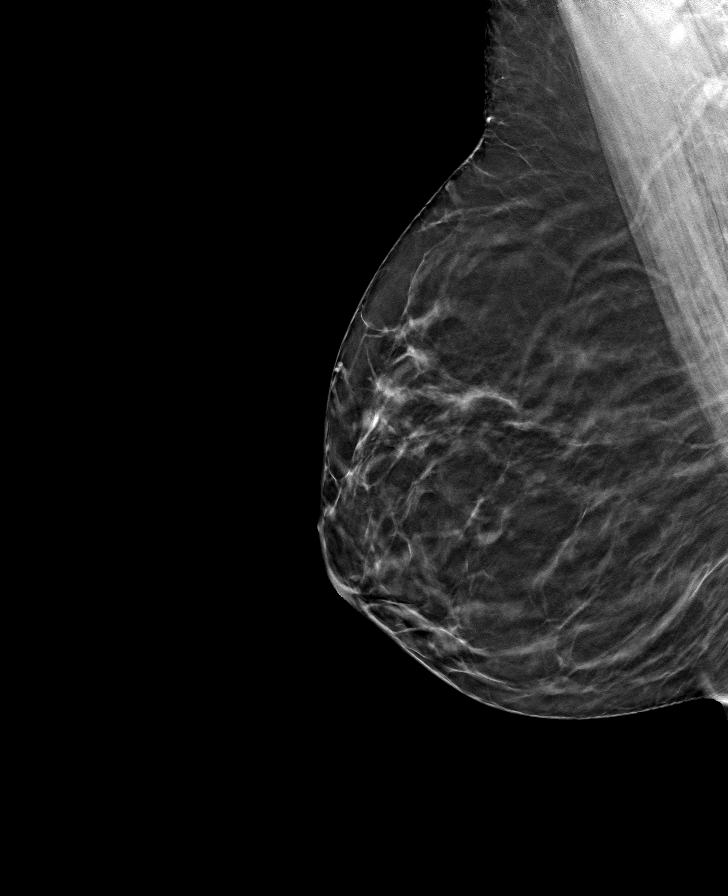

[L MLO tomo · tomo slice 35/68.0]
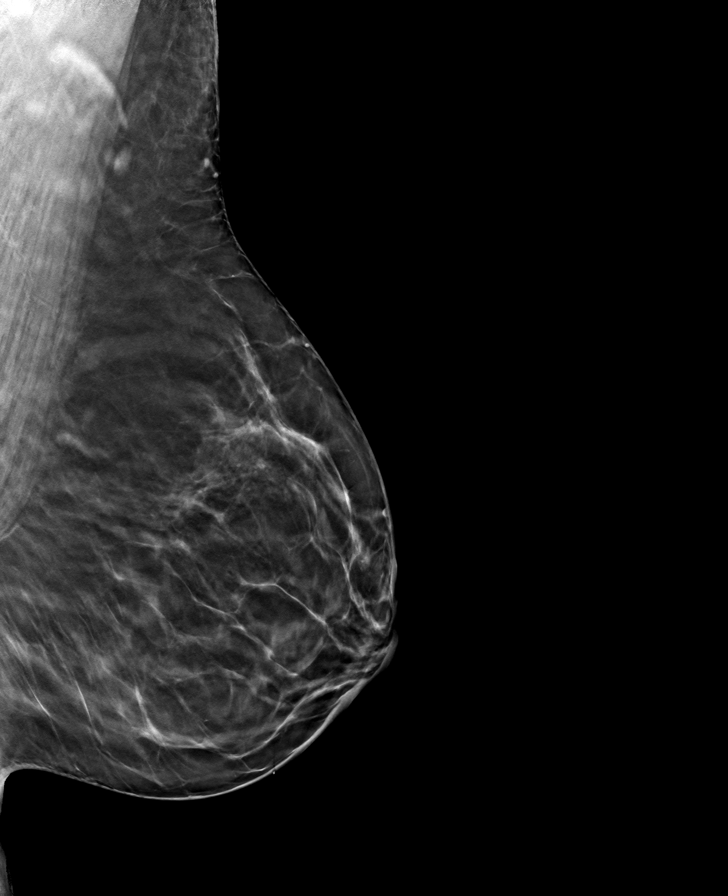

[L CC tomo · tomo slice 34/67.0]
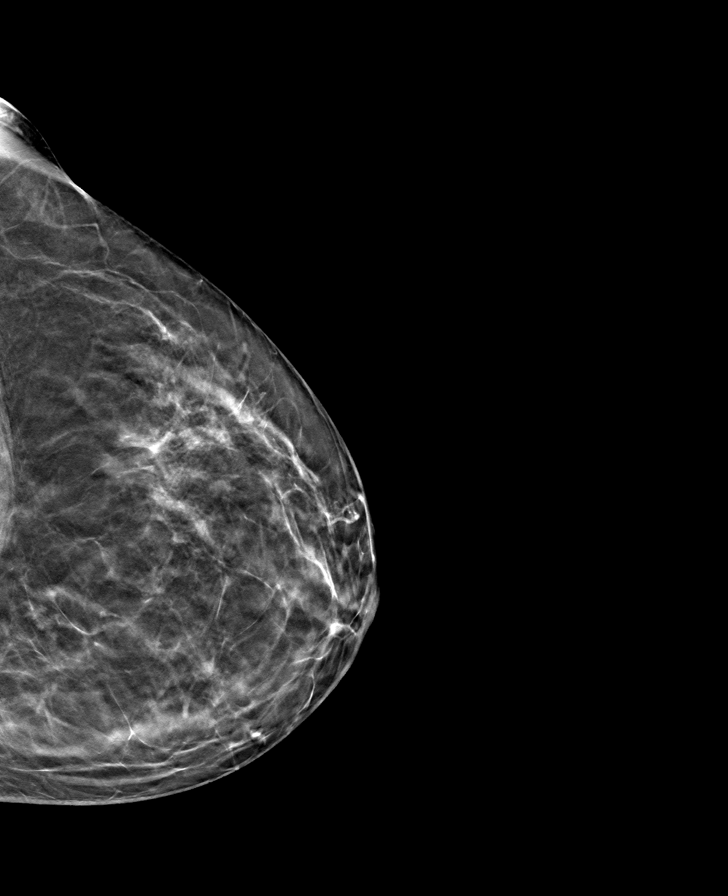

[R CC tomo · tomo slice 33/65.0]
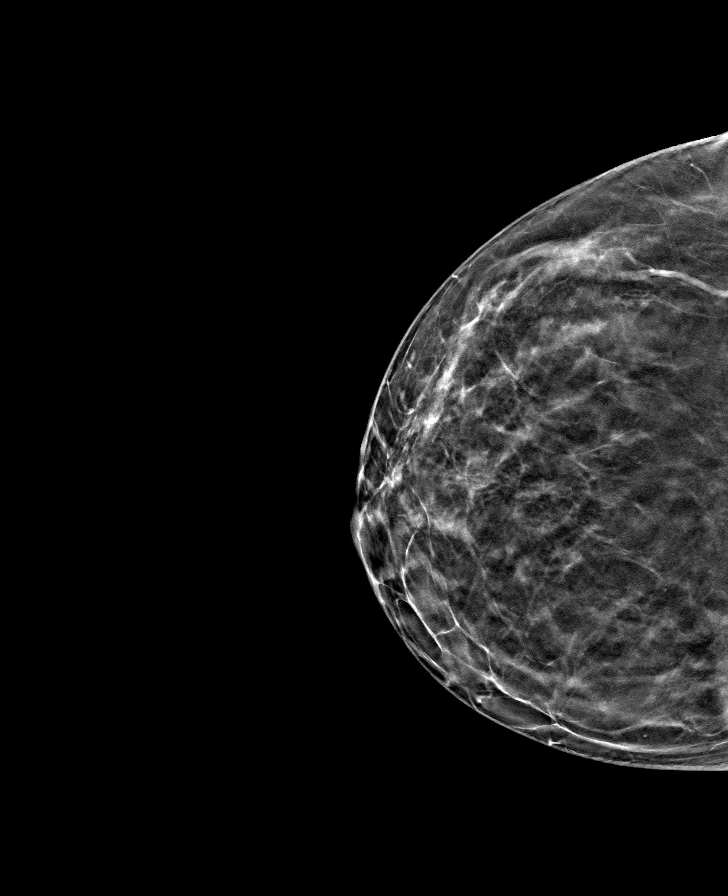

[8 of 24 positions shown; findings below may reference images not displayed]

ACR Breast Density Category c: The breast tissue is heterogeneously
dense, which may obscure small masses.
FINDINGS: There are no findings suspicious for malignancy.
IMPRESSION: No mammographic evidence of malignancy. A result letter of this
screening mammogram will be mailed directly to the patient.

RECOMMENDATION:
Screening mammogram in one year. (Code:[id number]-[id number])

BI-RADS CATEGORY  1: Negative.

## 2023-03-10 ENCOUNTER — Other Ambulatory Visit: Payer: Self-pay | Admitting: Family Medicine

## 2023-03-10 DIAGNOSIS — J0101 Acute recurrent maxillary sinusitis: Secondary | ICD-10-CM

## 2023-07-06 ENCOUNTER — Other Ambulatory Visit: Payer: Self-pay | Admitting: Family Medicine

## 2023-07-06 ENCOUNTER — Other Ambulatory Visit: Payer: Self-pay | Admitting: Gastroenterology

## 2023-07-06 DIAGNOSIS — J0101 Acute recurrent maxillary sinusitis: Secondary | ICD-10-CM

## 2023-09-30 ENCOUNTER — Other Ambulatory Visit: Payer: Self-pay | Admitting: Family Medicine

## 2023-09-30 DIAGNOSIS — J0101 Acute recurrent maxillary sinusitis: Secondary | ICD-10-CM

## 2023-09-30 NOTE — Telephone Encounter (Signed)
[name] NTBS last OV greater than a year

## 2023-09-30 NOTE — Telephone Encounter (Signed)
I called pt & told her that she will NTBS before she can get a refill on her nasal spray RX bc she has not been here in over a year. She will check with her work schedule & call us back to make an appt.

## 2023-12-08 ENCOUNTER — Encounter: Payer: Self-pay | Admitting: *Deleted

## 2023-12-23 ENCOUNTER — Ambulatory Visit: Payer: Medicare HMO

## 2023-12-23 ENCOUNTER — Ambulatory Visit: Payer: Medicare HMO | Admitting: Family Medicine

## 2024-01-04 ENCOUNTER — Other Ambulatory Visit: Payer: Self-pay | Admitting: Family Medicine

## 2024-01-04 ENCOUNTER — Other Ambulatory Visit: Payer: Self-pay | Admitting: Gastroenterology

## 2024-01-04 DIAGNOSIS — E782 Mixed hyperlipidemia: Secondary | ICD-10-CM

## 2024-01-04 NOTE — Telephone Encounter (Signed)
Needs ov. More than one year since last ov. Limited refills provided

## 2024-01-13 ENCOUNTER — Encounter: Payer: Self-pay | Admitting: Family Medicine

## 2024-01-13 ENCOUNTER — Ambulatory Visit (INDEPENDENT_AMBULATORY_CARE_PROVIDER_SITE_OTHER): Payer: Medicare HMO | Admitting: Family Medicine

## 2024-01-13 VITALS — BP 127/70 | HR 83 | Temp 98.1°F | Ht 63.0 in | Wt 189.4 lb

## 2024-01-13 DIAGNOSIS — K219 Gastro-esophageal reflux disease without esophagitis: Secondary | ICD-10-CM | POA: Diagnosis not present

## 2024-01-13 DIAGNOSIS — E782 Mixed hyperlipidemia: Secondary | ICD-10-CM | POA: Diagnosis not present

## 2024-01-13 DIAGNOSIS — J310 Chronic rhinitis: Secondary | ICD-10-CM

## 2024-01-13 DIAGNOSIS — E559 Vitamin D deficiency, unspecified: Secondary | ICD-10-CM

## 2024-01-13 DIAGNOSIS — Z6833 Body mass index (BMI) 33.0-33.9, adult: Secondary | ICD-10-CM

## 2024-01-13 DIAGNOSIS — Z0001 Encounter for general adult medical examination with abnormal findings: Secondary | ICD-10-CM | POA: Diagnosis not present

## 2024-01-13 DIAGNOSIS — E66811 Obesity, class 1: Secondary | ICD-10-CM

## 2024-01-13 DIAGNOSIS — Z0000 Encounter for general adult medical examination without abnormal findings: Secondary | ICD-10-CM

## 2024-01-13 DIAGNOSIS — Z23 Encounter for immunization: Secondary | ICD-10-CM

## 2024-01-13 DIAGNOSIS — E6609 Other obesity due to excess calories: Secondary | ICD-10-CM | POA: Diagnosis not present

## 2024-01-13 DIAGNOSIS — F17201 Nicotine dependence, unspecified, in remission: Secondary | ICD-10-CM

## 2024-01-13 DIAGNOSIS — G4733 Obstructive sleep apnea (adult) (pediatric): Secondary | ICD-10-CM

## 2024-01-13 MED ORDER — FLUTICASONE PROPIONATE 50 MCG/ACT NA SUSP: 2.0000 | Freq: Every day | NASAL | 0 refills | Status: DC

## 2024-01-13 MED ORDER — LANSOPRAZOLE 30 MG PO CPDR: 30.0000 mg | DELAYED_RELEASE_CAPSULE | Freq: Two times a day (BID) | ORAL | 3 refills | Status: AC

## 2024-01-13 MED ORDER — EZETIMIBE 10 MG PO TABS: 10.0000 mg | ORAL_TABLET | Freq: Every day | ORAL | 3 refills | Status: AC

## 2024-01-13 NOTE — Addendum Note (Signed)
Addended by: Gabriel Earing on: 01/13/2024 02:46 PM   Modules accepted: Level of Service
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
## 2024-01-13 NOTE — Patient Instructions (Signed)

## 2024-01-14 LAB — CBC WITH DIFFERENTIAL/PLATELET
Basophils Absolute: 0.1 10*3/uL (ref 0.0–0.2)
Basos: 1 %
EOS (ABSOLUTE): 0.1 10*3/uL (ref 0.0–0.4)
Eos: 1 %
Hematocrit: 43.3 % (ref 34.0–46.6)
Hemoglobin: 14.8 g/dL (ref 11.1–15.9)
Immature Grans (Abs): 0 10*3/uL (ref 0.0–0.1)
Immature Granulocytes: 0 %
Lymphocytes Absolute: 4.5 10*3/uL — ABNORMAL HIGH (ref 0.7–3.1)
Lymphs: 48 %
MCH: 30.6 pg (ref 26.6–33.0)
MCHC: 34.2 g/dL (ref 31.5–35.7)
MCV: 90 fL (ref 79–97)
Monocytes Absolute: 0.5 10*3/uL (ref 0.1–0.9)
Monocytes: 5 %
Neutrophils Absolute: 4.2 10*3/uL (ref 1.4–7.0)
Neutrophils: 45 %
Platelets: 335 10*3/uL (ref 150–450)
RBC: 4.84 x10E6/uL (ref 3.77–5.28)
RDW: 12.5 % (ref 11.7–15.4)
WBC: 9.4 10*3/uL (ref 3.4–10.8)

## 2024-01-14 LAB — CMP14+EGFR
ALT: 29 [IU]/L (ref 0–32)
AST: 18 [IU]/L (ref 0–40)
Albumin: 4.5 g/dL (ref 3.8–4.9)
Alkaline Phosphatase: 61 [IU]/L (ref 44–121)
BUN/Creatinine Ratio: 15 (ref 9–23)
BUN: 14 mg/dL (ref 6–24)
Bilirubin Total: 0.4 mg/dL (ref 0.0–1.2)
CO2: 22 mmol/L (ref 20–29)
Calcium: 9.3 mg/dL (ref 8.7–10.2)
Chloride: 103 mmol/L (ref 96–106)
Creatinine, Ser: 0.94 mg/dL (ref 0.57–1.00)
Globulin, Total: 2.3 g/dL (ref 1.5–4.5)
Glucose: 86 mg/dL (ref 70–99)
Potassium: 4.3 mmol/L (ref 3.5–5.2)
Sodium: 139 mmol/L (ref 134–144)
Total Protein: 6.8 g/dL (ref 6.0–8.5)
eGFR: 73 mL/min/{1.73_m2} (ref >59)

## 2024-01-14 LAB — LIPID PANEL
Chol/HDL Ratio: 6.1 {ratio} — ABNORMAL HIGH (ref 0.0–4.4)
Cholesterol, Total: 245 mg/dL — ABNORMAL HIGH (ref 100–199)
HDL: 40 mg/dL (ref >39)
LDL Chol Calc (NIH): 171 mg/dL — ABNORMAL HIGH (ref 0–99)
Triglycerides: 183 mg/dL — ABNORMAL HIGH (ref 0–149)
VLDL Cholesterol Cal: 34 mg/dL (ref 5–40)

## 2024-01-14 LAB — VITAMIN D 25 HYDROXY (VIT D DEFICIENCY, FRACTURES): Vit D, 25-Hydroxy: 17.9 ng/mL — ABNORMAL LOW (ref 30.0–100.0)

## 2024-01-14 LAB — TSH: TSH: 1.9 u[IU]/mL (ref 0.450–4.500)

## 2024-01-16 ENCOUNTER — Other Ambulatory Visit: Payer: Self-pay | Admitting: Family Medicine

## 2024-01-16 DIAGNOSIS — E559 Vitamin D deficiency, unspecified: Secondary | ICD-10-CM

## 2024-01-16 MED ORDER — VITAMIN D (ERGOCALCIFEROL) 1.25 MG (50000 UNIT) PO CAPS: 50000.0000 [IU] | ORAL_CAPSULE | ORAL | 0 refills | Status: DC

## 2024-01-30 ENCOUNTER — Ambulatory Visit (INDEPENDENT_AMBULATORY_CARE_PROVIDER_SITE_OTHER): Admitting: Family Medicine

## 2024-01-30 ENCOUNTER — Encounter: Payer: Self-pay | Admitting: Family Medicine

## 2024-01-30 VITALS — BP 116/65 | HR 79 | Temp 98.0°F | Ht 63.0 in | Wt 185.4 lb

## 2024-01-30 DIAGNOSIS — R509 Fever, unspecified: Secondary | ICD-10-CM | POA: Diagnosis not present

## 2024-01-30 DIAGNOSIS — J101 Influenza due to other identified influenza virus with other respiratory manifestations: Secondary | ICD-10-CM | POA: Diagnosis not present

## 2024-01-30 LAB — VERITOR FLU A/B WAIVED
Influenza A: POSITIVE — AB
Influenza B: NEGATIVE

## 2024-01-30 MED ORDER — OSELTAMIVIR PHOSPHATE 75 MG PO CAPS: 75.0000 mg | ORAL_CAPSULE | Freq: Two times a day (BID) | ORAL | 0 refills | Status: AC

## 2024-01-30 NOTE — Progress Notes (Signed)
 Acute Office Visit  Subjective:     Patient ID: Emily Weeks, female    DOB: Oct 06, 1971, 53 y.o.   MRN: [id number]  Chief Complaint  Patient presents with   Fever   Generalized Body Aches    Fever  This is a new problem. Episode onset: 3 days. The problem has been gradually improving. The maximum temperature noted was 102 to 102.9 F. Associated symptoms include congestion, coughing, headaches, muscle aches, nausea and vomiting. Pertinent negatives include no abdominal pain, chest pain, diarrhea, ear pain, sore throat, urinary pain or wheezing.    Review of Systems  Constitutional:  Positive for fever.  HENT:  Positive for congestion. Negative for ear pain and sore throat.   Respiratory:  Positive for cough. Negative for wheezing.   Cardiovascular:  Negative for chest pain.  Gastrointestinal:  Positive for nausea and vomiting. Negative for abdominal pain and diarrhea.  Genitourinary:  Negative for dysuria.  Neurological:  Positive for headaches.        Objective:    BP 116/65   Pulse 79   Temp 98 F (36.7 C) (Oral)   Ht 5\' 3"  (1.6 m)   Wt 185 lb 6.4 oz (84.1 kg)   LMP 03/22/2018   SpO2 96%   BMI 32.84 kg/m    Physical Exam Vitals and nursing note reviewed.  Constitutional:      General: She is not in acute distress.    Appearance: She is ill-appearing. She is not toxic-appearing or diaphoretic.  HENT:     Head: Normocephalic and atraumatic.     Right Ear: Tympanic membrane, ear canal and external ear normal.     Left Ear: Tympanic membrane, ear canal and external ear normal.     Nose: Congestion present.     Mouth/Throat:     Mouth: Mucous membranes are moist.     Pharynx: Oropharynx is clear. No oropharyngeal exudate or posterior oropharyngeal erythema.  Eyes:     General:        Right eye: No discharge.        Left eye: No discharge.     Conjunctiva/sclera: Conjunctivae normal.  Cardiovascular:     Rate and Rhythm: Normal rate and regular rhythm.      Heart sounds: Normal heart sounds. No murmur heard. Pulmonary:     Effort: Pulmonary effort is normal. No respiratory distress.     Breath sounds: Normal breath sounds. No wheezing, rhonchi or rales.  Abdominal:     General: Bowel sounds are normal. There is no distension.     Palpations: Abdomen is soft.     Tenderness: There is no abdominal tenderness. There is no guarding or rebound.  Musculoskeletal:     Cervical back: Neck supple. No rigidity.     Right lower leg: No edema.     Left lower leg: No edema.  Skin:    General: Skin is warm and dry.  Neurological:     General: No focal deficit present.     Mental Status: She is alert and oriented to person, place, and time.  Psychiatric:        Mood and Affect: Mood normal.        Behavior: Behavior normal.     No results found for any visits on 01/30/24.      Assessment & Plan:   Madden was seen today for fever and generalized body aches.  Diagnoses and all orders for this visit:  Influenza A + rapid flu today.  Tamiflu discussed and ordered. May return to work when afebrile for 24 hours and symptoms have improved. Discussed symptomatic care and return precautions.  -     Veritor Flu A/B Waived -     oseltamivir (TAMIFLU) 75 MG capsule; Take 1 capsule (75 mg total) by mouth 2 (two) times daily for 5 days.  The patient indicates understanding of these issues and agrees with the plan.  Gabriel Earing, FNP

## 2024-02-07 ENCOUNTER — Other Ambulatory Visit: Payer: Self-pay | Admitting: *Deleted

## 2024-02-07 DIAGNOSIS — N951 Menopausal and female climacteric states: Secondary | ICD-10-CM

## 2024-02-08 ENCOUNTER — Telehealth: Payer: Self-pay | Admitting: Obstetrics & Gynecology

## 2024-02-08 NOTE — Telephone Encounter (Signed)
 Pt is upset about her prescriptions not being sent to her pharmacy. Pt is waiting for a call from the office.

## 2024-02-09 ENCOUNTER — Other Ambulatory Visit: Payer: Self-pay | Admitting: Obstetrics & Gynecology

## 2024-02-09 DIAGNOSIS — N951 Menopausal and female climacteric states: Secondary | ICD-10-CM

## 2024-02-09 MED ORDER — PROGESTERONE MICRONIZED 100 MG PO CAPS: 100.0000 mg | ORAL_CAPSULE | Freq: Every day | ORAL | 1 refills | Status: DC

## 2024-02-09 MED ORDER — ESTRADIOL 0.025 MG/24HR TD PTWK: 0.0250 mg | MEDICATED_PATCH | TRANSDERMAL | 1 refills | Status: DC

## 2024-02-09 NOTE — Telephone Encounter (Signed)
 LMOV[facility] Dr Charlotta Newton will refill for a 3mos refill at most but wants her to be seen yearly. She was last seen Feb 2024.  Advised to call and make appt for refills.

## 2024-02-09 NOTE — Progress Notes (Signed)
 HRT refill x 1 mos until appt  Myna Hidalgo, DO Attending Obstetrician & Gynecologist, [facility] for [facility], [facility]

## 2024-03-06 ENCOUNTER — Other Ambulatory Visit: Payer: Self-pay | Admitting: Family Medicine

## 2024-03-06 DIAGNOSIS — Z1231 Encounter for screening mammogram for malignant neoplasm of breast: Secondary | ICD-10-CM

## 2024-03-14 ENCOUNTER — Other Ambulatory Visit (HOSPITAL_COMMUNITY)
Admission: RE | Admit: 2024-03-14 | Discharge: 2024-03-14 | Disposition: A | Discharge status: Home / Self Care | Source: Ambulatory Visit | Attending: Obstetrics & Gynecology | Admitting: Obstetrics & Gynecology

## 2024-03-14 ENCOUNTER — Ambulatory Visit: Admitting: Obstetrics & Gynecology

## 2024-03-14 ENCOUNTER — Encounter: Payer: Self-pay | Admitting: Obstetrics & Gynecology

## 2024-03-14 VITALS — BP 139/69 | HR 68 | Ht 63.0 in | Wt 181.0 lb

## 2024-03-14 DIAGNOSIS — Z87410 Personal history of cervical dysplasia: Secondary | ICD-10-CM

## 2024-03-14 DIAGNOSIS — Z79890 Hormone replacement therapy (postmenopausal): Secondary | ICD-10-CM | POA: Diagnosis not present

## 2024-03-14 DIAGNOSIS — N951 Menopausal and female climacteric states: Secondary | ICD-10-CM | POA: Diagnosis not present

## 2024-03-14 DIAGNOSIS — Z01419 Encounter for gynecological examination (general) (routine) without abnormal findings: Secondary | ICD-10-CM

## 2024-03-14 MED ORDER — PROGESTERONE MICRONIZED 100 MG PO CAPS: 100.0000 mg | ORAL_CAPSULE | Freq: Every day | ORAL | 4 refills | Status: AC

## 2024-03-14 MED ORDER — ESTRADIOL 0.025 MG/24HR TD PTWK: 0.0250 mg | MEDICATED_PATCH | TRANSDERMAL | 11 refills | Status: AC

## 2024-03-14 NOTE — Progress Notes (Signed)
 WELL-WOMAN EXAMINATION Patient name: Emily Weeks MRN [id number]  Date of birth: 19-Aug-1971 Chief Complaint:   Gynecologic Exam  History of Present Illness:   Emily Weeks is a 53 y.o. 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  Symptoms are "almost gone."  Rates her symptoms 1 or2/10, just depends on stress level.  On occasion cramping and some dyspareunia almost like cramping pain, but not regularly.  Not specifically vaginal dryness, rather internal cramping.  Currently not an issue, just wanted to mention.  No other acute complaints   Patient's last menstrual period was 03/22/2018.  The current method of family planning is post menopausal status.    Last pap collected today.  Last mammogram: scheduled for next Wed. Last colonoscopy: 2024     03/14/2024    8:37 AM 01/30/2024    9:49 AM 01/13/2024    2:12 PM 12/13/2022    1:22 PM 09/21/2022    9:07 AM  Depression screen PHQ 2/9  Decreased Interest 0 0 0 0 1  Down, Depressed, Hopeless 0 0 0 0 1  PHQ - 2 Score 0 0 0 0 2  Altered sleeping 1 0 1  2  Tired, decreased energy 1 1 0  2  Change in appetite 0 1 0  0  Feeling bad or failure about yourself  0 0 0  0  Trouble concentrating 0 1 0  1  Moving slowly or fidgety/restless 0 0 0  0  Suicidal thoughts 0 0 0  0  PHQ-9 Score 2 3 1  7   Difficult doing work/chores  Not difficult at all Not difficult at all  Not difficult at all      Review of Systems:   Pertinent items are noted in HPI Denies any headaches, blurred vision, fatigue, shortness of breath, chest pain, abdominal pain, bowel movements, urination, or intercourse unless otherwise stated above.  Pertinent History Reviewed:  Reviewed past  medical,surgical, social and family history.  Reviewed problem list, medications and allergies. Physical Assessment:   Vitals:   03/14/24 0836  BP: 139/69  Pulse: 68  Weight: 181 lb (82.1 kg)  Height: 5\' 3"  (1.6 m)  Body mass index is 32.06 kg/m.        Physical Examination:   General appearance - well appearing, and in no distress  Mental status - alert, oriented to person, place, and time  Psych:  She has a normal mood and affect  Skin - warm and dry, normal color, no suspicious lesions noted  Chest - effort normal, all lung fields clear to auscultation bilaterally  Heart - normal rate and regular rhythm  Neck:  midline trachea, no thyromegaly or nodules  Breasts - breasts appear normal, no suspicious masses, no skin or nipple changes or  axillary nodes  Abdomen - soft, nontender, nondistended, no masses or organomegaly  Pelvic - VULVA: normal appearing vulva with no masses, tenderness or lesions  VAGINA: normal appearing vagina with normal color and discharge, no lesions  CERVIX: normal appearing cervix without discharge or lesions, no CMT  Thin prep pap is done with HR HPV cotesting  UTERUS: uterus is felt to be normal size, shape, consistency and nontender   ADNEXA: No adnexal masses or tenderness noted.  Extremities:  No  swelling or varicosities noted  Chaperone:  Steffanie Edouard      Assessment & Plan:  1) Well-Woman Exam -pap collected due to prior dysplasia, further management pending results -mammgram scheduled  2) HRT -plan to continue with current medication -reviewed PMH, no contraindications -discussed "lowest dose for shortest time frame"  No orders of the defined types were placed in this encounter.   Meds:  Meds ordered this encounter  Medications   progesterone  (PROMETRIUM ) 100 MG capsule    Sig: Take 1 capsule (100 mg total) by mouth daily.    Dispense:  90 capsule    Refill:  4   estradiol  (CLIMARA  - DOSED IN MG/24 HR) 0.025 mg/24hr patch     Sig: Place 1 patch (0.025 mg total) onto the skin once a week.    Dispense:  4 patch    Refill:  11    Follow-up: Return in about 1 year (around 03/14/2025) for Annual.   Kwinton Maahs, DO Attending Obstetrician & Gynecologist, [facility] for [facility], [facility]

## 2024-03-20 LAB — CYTOLOGY - PAP
Adequacy: ABSENT
Comment: NEGATIVE
Comment: NEGATIVE
Comment: NEGATIVE
Diagnosis: UNDETERMINED — AB
HPV 16: NEGATIVE
HPV 18 / 45: NEGATIVE
High risk HPV: POSITIVE — AB

## 2024-03-21 ENCOUNTER — Encounter: Payer: Self-pay | Admitting: Obstetrics & Gynecology

## 2024-03-21 ENCOUNTER — Ambulatory Visit
Admission: RE | Admit: 2024-03-21 | Discharge: 2024-03-21 | Disposition: A | Discharge status: Home / Self Care | Payer: BC Managed Care – PPO | Source: Ambulatory Visit | Attending: Family Medicine

## 2024-03-21 DIAGNOSIS — Z1231 Encounter for screening mammogram for malignant neoplasm of breast: Secondary | ICD-10-CM

## 2024-05-29 ENCOUNTER — Other Ambulatory Visit: Payer: Self-pay | Admitting: Family Medicine

## 2024-05-29 DIAGNOSIS — J310 Chronic rhinitis: Secondary | ICD-10-CM

## 2024-06-22 ENCOUNTER — Other Ambulatory Visit: Payer: Self-pay | Admitting: Medical Genetics

## 2024-07-13 ENCOUNTER — Other Ambulatory Visit (HOSPITAL_COMMUNITY)
Admission: RE | Admit: 2024-07-13 | Discharge: 2024-07-13 | Disposition: A | Discharge status: Home / Self Care | Payer: Self-pay | Source: Ambulatory Visit | Attending: Medical Genetics | Admitting: Medical Genetics

## 2024-07-25 LAB — GENECONNECT MOLECULAR SCREEN: Genetic Analysis Overall Interpretation: POSITIVE — AB

## 2024-07-26 ENCOUNTER — Telehealth: Payer: Self-pay | Admitting: Medical Genetics

## 2024-07-26 DIAGNOSIS — E7801 Familial hypercholesterolemia: Secondary | ICD-10-CM

## 2024-07-30 DIAGNOSIS — E7801 Familial hypercholesterolemia: Secondary | ICD-10-CM | POA: Insufficient documentation

## 2024-07-30 NOTE — Telephone Encounter (Signed)
 [facility] [facility] Positive Result Note 07/30/2024 11:02 AM  SECOND ATTEMPT: Confirmed I was speaking with Emily Weeks [id number] by using name and DOB. Informed participant the reason for this call is to provide results for the above study. 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 Genetic counseling was offered and participant requests a call back to schedule. All questions were answered, and participant was thanked for their time and support of the above study. Participant was encouraged to contact [facility] if they have any further questions or concerns.

## 2024-08-16 ENCOUNTER — Encounter: Payer: Self-pay | Admitting: Genetic Counselor

## 2024-08-16 ENCOUNTER — Ambulatory Visit (INDEPENDENT_AMBULATORY_CARE_PROVIDER_SITE_OTHER): Admitting: Genetic Counselor

## 2024-08-16 DIAGNOSIS — E7801 Familial hypercholesterolemia: Secondary | ICD-10-CM
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
## 2024-08-23 ENCOUNTER — Encounter: Payer: Self-pay | Admitting: Genetic Counselor

## 2024-08-28 ENCOUNTER — Ambulatory Visit (INDEPENDENT_AMBULATORY_CARE_PROVIDER_SITE_OTHER): Admitting: Internal Medicine

## 2024-08-28 ENCOUNTER — Encounter (HOSPITAL_BASED_OUTPATIENT_CLINIC_OR_DEPARTMENT_OTHER): Payer: Self-pay | Admitting: Internal Medicine

## 2024-08-28 VITALS — BP 120/82 | HR 64 | Ht 63.0 in | Wt 164.0 lb

## 2024-08-28 DIAGNOSIS — M791 Myalgia, unspecified site: Secondary | ICD-10-CM

## 2024-08-28 DIAGNOSIS — T466X5D Adverse effect of antihyperlipidemic and antiarteriosclerotic drugs, subsequent encounter: Secondary | ICD-10-CM | POA: Diagnosis not present

## 2024-08-28 DIAGNOSIS — I700 Atherosclerosis of aorta: Secondary | ICD-10-CM | POA: Diagnosis not present

## 2024-08-28 DIAGNOSIS — T466X5A Adverse effect of antihyperlipidemic and antiarteriosclerotic drugs, initial encounter: Secondary | ICD-10-CM

## 2024-08-28 DIAGNOSIS — E78019 Familial hypercholesterolemia, unspecified: Secondary | ICD-10-CM | POA: Diagnosis not present

## 2024-08-28 MED ORDER — REPATHA SURECLICK 140 MG/ML ~~LOC~~ SOAJ: 140.0000 mg | SUBCUTANEOUS | 3 refills | Status: AC
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
## 2024-08-28 NOTE — Patient Instructions (Signed)
 Medication Instructions:  Dr. Mona recommends Repatha (PCSK9). This is an injectable cholesterol medication self-administered once every 14 days. This medication will likely need prior approval with your insurance company, which we will work on. If the medication is not approved initially, we may need to do an appeal with your insurance. If approved, we will provide you with copay and cost information. We'll then send the prescription to your pharmacy. We would have you complete another set of fasting labs between 3-4 months to reassess cholesterol.   Repatha is self-injected once every 14 days in subcutaneous or fatty tissue - such as belly or side/outer/upper thigh. It is best stored in the refrigerator but is stable at room temp up to 28 days. Please take the pen-injector out of fridge about 30 minutes - 1 hour prior to injection, to allow it to warm closer to room temperature.   This medication is very effective in lowering LDL and can lower LPa, as Dr. Mona mentioned. It is also generally well tolerated -- most common reaction may be cold-like symptoms such as runny nose, scratchy throat, as this is an antibody therapy. It is generally self-limiting and after a few doses, your body should have normalized to the medication.   Here is a demo video: [email or web address]   If you need a co-pay card for Repatha: [email or web address]  Patient Assistance:    These foundations have funds at various times.   The [employer]: https://www.panfoundation.org/disease-funds/hypercholesterolemia/ -- can sign up for wait list  [facility] offers assistance to help pay for medication copays.  They will cover copays for all cholesterol lowering meds, including statins, fibrates, omega-3 fish oils like Vascepa, ezetimibe , Repatha, Praluent, Nexletol, Nexlizet.  The cards are usually good for $2,500 or 12 months, whichever comes first. Our fax #  is [phone] (you will need this to apply) Go to [email or web address] Click on "Apply Now" Answer questions as to whom is applying (patient or representative) Your disease fund will be "hypercholesterolemia - Medicare access" They will ask questions about finances and which medications you are taking for cholesterol When you submit, the approval is usually within minutes.  You will need to print the card information from the site You will need to show this information to your pharmacy, they will bill your Medicare Part D plan first -then bill [facility] --for the copay.   You can also call them at [phone], although the hold times can be quite long.     *If you need a refill on your cardiac medications before your next appointment, please call your pharmacy*  Lab Work: Return for blood work in 3 months (NMR lipoprofile, Lp(a)  Testing/Procedures: none  Follow-Up: As needed

## 2024-08-29 ENCOUNTER — Other Ambulatory Visit (HOSPITAL_COMMUNITY): Payer: Self-pay

## 2024-08-29 ENCOUNTER — Telehealth: Payer: Self-pay | Admitting: Pharmacy Technician

## 2024-08-29 NOTE — Telephone Encounter (Addendum)
 Pharmacy Patient Advocate Encounter   Received notification from CoverMyMeds that prior authorization for Repatha is required/requested.   Insurance verification completed.   The patient is insured through [place].   Per test claim: PA required; PA submitted to above mentioned insurance via Latent Key/confirmation #/EOC [id number] Status is pending   SECOND INSURANCE: Pharmacy Patient Advocate Encounter   Received notification from CoverMyMeds that prior authorization for Repatha is required/requested.   Insurance verification completed.   The patient is insured through [employer].   Per test claim: PA required; PA submitted to above mentioned insurance via Latent Key/confirmation #/EOC [id number] Status is pending

## 2024-08-29 NOTE — Telephone Encounter (Signed)
 Pharmacy Patient Advocate Encounter- patient has 2 insurances  Received notification from [employer] that Prior Authorization for repatha has been APPROVED from 08/29/24 to 11/21/2038   PA #/Case ID/Reference #: [id number]

## 2024-08-29 NOTE — Telephone Encounter (Signed)
 Pharmacy Patient Advocate Encounter  Received notification from [employer] that Prior Authorization for repatha has been APPROVED from 08/29/24 to 11/21/25   PA #/Case ID/Reference #: [id number]

## 2024-12-25 NOTE — Telephone Encounter (Signed)
 Pharmacy called to follow up on PA for Pri,ary and secondary Insurance.

## 2024-12-26 ENCOUNTER — Encounter: Payer: Self-pay | Admitting: Pharmacy Technician

## 2024-12-26 ENCOUNTER — Telehealth: Payer: Self-pay | Admitting: Pharmacy Technician

## 2024-12-26 ENCOUNTER — Other Ambulatory Visit (HOSPITAL_COMMUNITY): Payer: Self-pay

## 2024-12-26 NOTE — Telephone Encounter (Signed)
 Now fixed

## 2024-12-26 NOTE — Telephone Encounter (Signed)
 dup

## 2024-12-26 NOTE — Telephone Encounter (Signed)
" ° °  I called [employer] and both these insurances are still good.   They had the patient having medicaid but we did not have her having medicaid. He reactivated the [employer] and is 12.65 for 84 days "

## 2025-01-18 ENCOUNTER — Encounter: Payer: Medicare HMO | Admitting: Family Medicine
